# Patient Record
Sex: Male | Born: 1955 | Race: Black or African American | Hispanic: No | Marital: Married | Smoking: Former smoker
Health system: Southern US, Community
[De-identification: ages and names within clinical notes are randomized; demographics above are authoritative.]

## PROBLEM LIST (undated history)

## (undated) DIAGNOSIS — M1A00X0 Idiopathic chronic gout, unspecified site, without tophus (tophi): Secondary | ICD-10-CM

## (undated) DIAGNOSIS — N35919 Unspecified urethral stricture, male, unspecified site: Secondary | ICD-10-CM

## (undated) DIAGNOSIS — T485X5A Adverse effect of other anti-common-cold drugs, initial encounter: Secondary | ICD-10-CM

## (undated) DIAGNOSIS — J310 Chronic rhinitis: Secondary | ICD-10-CM

## (undated) DIAGNOSIS — E785 Hyperlipidemia, unspecified: Secondary | ICD-10-CM

## (undated) DIAGNOSIS — I10 Essential (primary) hypertension: Secondary | ICD-10-CM

## (undated) DIAGNOSIS — H409 Unspecified glaucoma: Secondary | ICD-10-CM

## (undated) DIAGNOSIS — N390 Urinary tract infection, site not specified: Secondary | ICD-10-CM

## (undated) DIAGNOSIS — E119 Type 2 diabetes mellitus without complications: Secondary | ICD-10-CM

## (undated) DIAGNOSIS — M1A9XX0 Chronic gout, unspecified, without tophus (tophi): Secondary | ICD-10-CM

## (undated) HISTORY — DX: Type 2 diabetes mellitus without complications: E11.9

## (undated) HISTORY — PX: TONSILLECTOMY: SUR1361

## (undated) HISTORY — PX: CIRCUMCISION: SUR203

## (undated) HISTORY — DX: Chronic rhinitis: J31.0

## (undated) HISTORY — DX: Adverse effect of other anti-common-cold drugs, initial encounter: T48.5X5A

## (undated) HISTORY — DX: Chronic gout, unspecified, without tophus (tophi): M1A.9XX0

## (undated) HISTORY — DX: Morbid (severe) obesity due to excess calories: E66.01

## (undated) HISTORY — DX: Unspecified glaucoma: H40.9

## (undated) HISTORY — DX: Essential (primary) hypertension: I10

## (undated) HISTORY — DX: Idiopathic chronic gout, unspecified site, without tophus (tophi): M1A.00X0

## (undated) HISTORY — DX: Hyperlipidemia, unspecified: E78.5

---

## 2006-11-01 ENCOUNTER — Emergency Department: Payer: Self-pay | Admitting: Emergency Medicine

## 2006-11-16 ENCOUNTER — Ambulatory Visit: Payer: Self-pay | Admitting: Urology

## 2008-12-07 ENCOUNTER — Ambulatory Visit: Payer: Self-pay | Admitting: Gastroenterology

## 2009-06-18 ENCOUNTER — Ambulatory Visit: Payer: Self-pay | Admitting: Anesthesiology

## 2009-06-18 ENCOUNTER — Ambulatory Visit: Payer: Self-pay | Admitting: Urology

## 2012-04-26 ENCOUNTER — Ambulatory Visit: Payer: Self-pay | Admitting: Family Medicine

## 2012-05-14 ENCOUNTER — Ambulatory Visit: Payer: Self-pay | Admitting: Family Medicine

## 2012-06-13 ENCOUNTER — Ambulatory Visit: Payer: Self-pay | Admitting: Family Medicine

## 2012-07-14 ENCOUNTER — Ambulatory Visit: Payer: Self-pay | Admitting: Family Medicine

## 2014-06-27 ENCOUNTER — Ambulatory Visit: Payer: Self-pay | Admitting: Gastroenterology

## 2015-01-26 ENCOUNTER — Telehealth: Payer: Self-pay

## 2015-01-26 DIAGNOSIS — M1A00X0 Idiopathic chronic gout, unspecified site, without tophus (tophi): Secondary | ICD-10-CM | POA: Insufficient documentation

## 2015-01-26 DIAGNOSIS — E1165 Type 2 diabetes mellitus with hyperglycemia: Secondary | ICD-10-CM | POA: Insufficient documentation

## 2015-01-26 DIAGNOSIS — E785 Hyperlipidemia, unspecified: Secondary | ICD-10-CM | POA: Insufficient documentation

## 2015-01-26 DIAGNOSIS — I10 Essential (primary) hypertension: Secondary | ICD-10-CM | POA: Insufficient documentation

## 2015-01-26 NOTE — Telephone Encounter (Signed)
90 day supply Amlodipine 

## 2015-01-29 MED ORDER — AMLODIPINE BESYLATE 10 MG PO TABS: 10.0000 mg | ORAL_TABLET | Freq: Every day | ORAL | Status: DC

## 2015-01-31 ENCOUNTER — Telehealth: Payer: Self-pay

## 2015-01-31 MED ORDER — LOSARTAN POTASSIUM 100 MG PO TABS: 100.0000 mg | ORAL_TABLET | Freq: Every day | ORAL | Status: DC

## 2015-01-31 NOTE — Telephone Encounter (Signed)
Requesting 90 day Rx for Losartan Potassium Tab 

## 2015-07-18 ENCOUNTER — Other Ambulatory Visit: Payer: Self-pay

## 2015-07-18 ENCOUNTER — Other Ambulatory Visit: Payer: Self-pay | Admitting: Family Medicine

## 2015-07-18 MED ORDER — TAMSULOSIN HCL 0.4 MG PO CAPS: 0.4000 mg | ORAL_CAPSULE | Freq: Every day | ORAL | Status: DC

## 2015-07-18 NOTE — Telephone Encounter (Signed)
apt 

## 2015-07-18 NOTE — Telephone Encounter (Signed)
[facility] requesting 90 day Rx for Tamsulosin Last visit 10/31/14

## 2015-07-23 ENCOUNTER — Emergency Department: Payer: BLUE CROSS/BLUE SHIELD

## 2015-07-23 ENCOUNTER — Emergency Department
Admission: EM | Admit: 2015-07-23 | Discharge: 2015-07-23 | Disposition: A | Discharge status: Home / Self Care | Payer: BLUE CROSS/BLUE SHIELD | Source: Home / Self Care | Attending: Emergency Medicine | Admitting: Emergency Medicine

## 2015-07-23 DIAGNOSIS — Z7951 Long term (current) use of inhaled steroids: Secondary | ICD-10-CM

## 2015-07-23 DIAGNOSIS — S40011A Contusion of right shoulder, initial encounter: Secondary | ICD-10-CM

## 2015-07-23 DIAGNOSIS — S99922A Unspecified injury of left foot, initial encounter: Secondary | ICD-10-CM

## 2015-07-23 DIAGNOSIS — Y998 Other external cause status: Secondary | ICD-10-CM

## 2015-07-23 DIAGNOSIS — W06XXXA Fall from bed, initial encounter: Secondary | ICD-10-CM | POA: Insufficient documentation

## 2015-07-23 DIAGNOSIS — N390 Urinary tract infection, site not specified: Secondary | ICD-10-CM

## 2015-07-23 DIAGNOSIS — R571 Hypovolemic shock: Secondary | ICD-10-CM | POA: Diagnosis not present

## 2015-07-23 DIAGNOSIS — Z87891 Personal history of nicotine dependence: Secondary | ICD-10-CM | POA: Insufficient documentation

## 2015-07-23 DIAGNOSIS — R824 Acetonuria: Secondary | ICD-10-CM

## 2015-07-23 DIAGNOSIS — Y9389 Activity, other specified: Secondary | ICD-10-CM | POA: Insufficient documentation

## 2015-07-23 DIAGNOSIS — Z79899 Other long term (current) drug therapy: Secondary | ICD-10-CM

## 2015-07-23 DIAGNOSIS — Z794 Long term (current) use of insulin: Secondary | ICD-10-CM

## 2015-07-23 DIAGNOSIS — Z7982 Long term (current) use of aspirin: Secondary | ICD-10-CM

## 2015-07-23 DIAGNOSIS — E1165 Type 2 diabetes mellitus with hyperglycemia: Secondary | ICD-10-CM

## 2015-07-23 DIAGNOSIS — Z7984 Long term (current) use of oral hypoglycemic drugs: Secondary | ICD-10-CM | POA: Insufficient documentation

## 2015-07-23 DIAGNOSIS — E119 Type 2 diabetes mellitus without complications: Secondary | ICD-10-CM

## 2015-07-23 DIAGNOSIS — Y9289 Other specified places as the place of occurrence of the external cause: Secondary | ICD-10-CM

## 2015-07-23 DIAGNOSIS — I10 Essential (primary) hypertension: Secondary | ICD-10-CM | POA: Insufficient documentation

## 2015-07-23 DIAGNOSIS — I214 Non-ST elevation (NSTEMI) myocardial infarction: Secondary | ICD-10-CM | POA: Diagnosis not present

## 2015-07-23 DIAGNOSIS — M79672 Pain in left foot: Secondary | ICD-10-CM

## 2015-07-23 LAB — COMPREHENSIVE METABOLIC PANEL
ALBUMIN: 3.5 g/dL (ref 3.5–5.0)
ALK PHOS: 86 U/L (ref 38–126)
ALT: 36 U/L (ref 17–63)
AST: 20 U/L (ref 15–41)
Anion gap: 14 (ref 5–15)
BILIRUBIN TOTAL: 2.1 mg/dL — AB (ref 0.3–1.2)
BUN: 28 mg/dL — AB (ref 6–20)
CALCIUM: 8.8 mg/dL — AB (ref 8.9–10.3)
CO2: 20 mmol/L — AB (ref 22–32)
CREATININE: 1.03 mg/dL (ref 0.61–1.24)
Chloride: 93 mmol/L — ABNORMAL LOW (ref 101–111)
GFR calc Af Amer: >60 mL/min (ref >60)
GFR calc non Af Amer: >60 mL/min (ref >60)
GLUCOSE: 436 mg/dL — AB (ref 65–99)
Potassium: 3.8 mmol/L (ref 3.5–5.1)
SODIUM: 127 mmol/L — AB (ref 135–145)
Total Protein: 6.9 g/dL (ref 6.5–8.1)

## 2015-07-23 LAB — URINALYSIS COMPLETE WITH MICROSCOPIC (ARMC ONLY)
BILIRUBIN URINE: NEGATIVE
Bacteria, UA: NONE SEEN
Nitrite: NEGATIVE
PH: 5 (ref 5.0–8.0)
PROTEIN: NEGATIVE mg/dL
Specific Gravity, Urine: 1.028 (ref 1.005–1.030)

## 2015-07-23 LAB — CBC WITH DIFFERENTIAL/PLATELET
BASOS PCT: 1 %
Basophils Absolute: 0.1 10*3/uL (ref 0–0.1)
EOS ABS: 0.1 10*3/uL (ref 0–0.7)
Eosinophils Relative: 0 %
HEMATOCRIT: 44.2 % (ref 40.0–52.0)
HEMOGLOBIN: 14.7 g/dL (ref 13.0–18.0)
LYMPHS ABS: 0.5 10*3/uL — AB (ref 1.0–3.6)
Lymphocytes Relative: 4 %
MCH: 26.8 pg (ref 26.0–34.0)
MCHC: 33.2 g/dL (ref 32.0–36.0)
MCV: 80.8 fL (ref 80.0–100.0)
Monocytes Absolute: 1.1 10*3/uL — ABNORMAL HIGH (ref 0.2–1.0)
Monocytes Relative: 8 %
NEUTROS ABS: 11.6 10*3/uL — AB (ref 1.4–6.5)
NEUTROS PCT: 87 %
Platelets: 134 10*3/uL — ABNORMAL LOW (ref 150–440)
RBC: 5.47 MIL/uL (ref 4.40–5.90)
RDW: 13.5 % (ref 11.5–14.5)
WBC: 13.3 10*3/uL — AB (ref 3.8–10.6)

## 2015-07-23 LAB — GLUCOSE, CAPILLARY: Glucose-Capillary: 319 mg/dL — ABNORMAL HIGH (ref 65–99)

## 2015-07-23 MED ORDER — OXYCODONE-ACETAMINOPHEN 5-325 MG PO TABS
2.0000 | ORAL_TABLET | Freq: Once | ORAL | Status: AC
  Administered 2015-07-23: 2 via ORAL
  Filled 2015-07-23: qty 2

## 2015-07-23 MED ORDER — INSULIN ASPART 100 UNIT/ML ~~LOC~~ SOLN
10.0000 [IU] | Freq: Once | SUBCUTANEOUS | Status: AC
  Administered 2015-07-23: 10 [IU] via INTRAVENOUS
  Filled 2015-07-23: qty 10

## 2015-07-23 MED ORDER — OXYCODONE-ACETAMINOPHEN 5-325 MG PO TABS: 1.0000 | ORAL_TABLET | ORAL | Status: DC | PRN

## 2015-07-23 MED ORDER — SODIUM CHLORIDE 0.9 % IV BOLUS (SEPSIS)
1000.0000 mL | Freq: Once | INTRAVENOUS | Status: AC
  Administered 2015-07-23: 1000 mL via INTRAVENOUS

## 2015-07-23 MED ORDER — SULFAMETHOXAZOLE-TRIMETHOPRIM 800-160 MG PO TABS: 1.0000 | ORAL_TABLET | Freq: Two times a day (BID) | ORAL | Status: DC

## 2015-07-23 NOTE — ED Notes (Signed)
States he has several c/o's    Having pain to left foot for couple of days  Unsure of injury. Also having some discomfort in shoulder

## 2015-07-23 NOTE — Discharge Instructions (Signed)
You will need to follow up with her doctor in 10 days if any continued urinary symptoms. Increase fluids. Check blood sugars routinely. Get better control of your blood sugars Percocet as needed for pain. Bactrim DS twice a day for 10 days for your urinary tract infection. Ice and elevate your left ankle for swelling and pain.

## 2015-07-23 NOTE — ED Notes (Signed)
Pt reports came in from shoveling snow and his son took his shoe off his left foot and bent it up. Pt reports pain to left foot since then. Pt also reports fell last pm and hurt his right shoulder, pt also c/o urinary difficulties states it is hard to make a stream. Pt reports is on meds for it but it is uncomfortable.

## 2015-07-23 NOTE — ED Provider Notes (Signed)
[facility] Emergency Department Provider Note  ____________________________________________  Time seen: Approximately 12:50 PM  I have reviewed the triage vital signs and the nursing notes.   HISTORY  Chief Complaint Dysuria; Foot Pain; and Shoulder Pain   HPI Edward Myers is a 60 y.o. male is here with multiple complaints. Patient states that last evening he fell from the bed and hit his right shoulder and continues to have right shoulder pain today. He also states that he was shoveling snow with his son and does not recall any injury that began having left foot pain. Patient does have a history of gout but states this feels quite different from his normal gouty arthritis attacks. He also states he is having some urinary difficulties. He denies any fever or chills. He has had no nausea, vomiting or diarrhea.He also has a history of having upper respiratory symptoms and his wife was sick 2 weeks ago with similar symptoms. Wife reports that patient has not taken his insulin or metformin in the last 3 days. Asked why patient states "I was too sick to get out of bed to do it". He rates his pain is 7 out of 10.   Past Medical History  Diagnosis Date  . Chronic gouty arthropathy   . Glaucoma   . Hyperlipidemia   . Hypertension   . Rhinitis medicamentosa   . Diabetes mellitus without complication   . Morbid obesity     Patient Active Problem List   Diagnosis Date Noted  . Hyperlipidemia   . Hypertension   . Chronic gouty arthropathy   . Diabetes mellitus without complication [facility])     Past Surgical History  Procedure Laterality Date  . Tonsillectomy    . Circumcision      Current Outpatient Rx  Name  Route  Sig  Dispense  Refill  . allopurinol (ZYLOPRIM) 100 MG tablet   Oral   Take 150 mg by mouth daily.         Marland Kitchen amLODipine (NORVASC) 10 MG tablet   Oral   Take 1 tablet (10 mg total) by mouth daily.   90 tablet   1   . aspirin EC 81  MG tablet   Oral   Take 81 mg by mouth daily.         Marland Kitchen atorvastatin (LIPITOR) 80 MG tablet   Oral   Take 80 mg by mouth daily.         . Dulaglutide (TRULICITY) 1.5 MG/0.5ML SOPN   Subcutaneous   Inject into the skin once a week.         . fexofenadine-pseudoephedrine (ALLEGRA-D) 60-120 MG per tablet   Oral   Take 1 tablet by mouth 2 (two) times daily as needed.         . fluticasone (FLONASE) 50 MCG/ACT nasal spray   Each Nare   Place 2 sprays into both nostrils daily.         . hydrochlorothiazide (HYDRODIURIL) 25 MG tablet   Oral   Take 25 mg by mouth daily.         . insulin aspart protamine - aspart (NOVOLOG MIX 70/30 FLEXPEN) (70-30) 100 UNIT/ML FlexPen   Subcutaneous   Inject 20-30 Units into the skin daily.         Marland Kitchen losartan (COZAAR) 100 MG tablet   Oral   Take 1 tablet (100 mg total) by mouth daily.   90 tablet   1   . metFORMIN (GLUCOPHAGE)  500 MG tablet   Oral   Take 1,000 mg by mouth 2 (two) times daily with a meal.         . oxyCODONE-acetaminophen (PERCOCET) 5-325 MG tablet   Oral   Take 1 tablet by mouth every 4 (four) hours as needed for severe pain.   20 tablet   0   . sulfamethoxazole-trimethoprim (BACTRIM DS,SEPTRA DS) 800-160 MG tablet   Oral   Take 1 tablet by mouth 2 (two) times daily.   20 tablet   0   . tamsulosin (FLOMAX) 0.4 MG CAPS capsule   Oral   Take 1 capsule (0.4 mg total) by mouth daily.   30 capsule   4     Allergies Review of patient's allergies indicates no known allergies.  Family History  Problem Relation Age of Onset  . Heart attack Mother   . Diabetes Mother     Social History Social History  Substance Use Topics  . Smoking status: Former Smoker    Types: Cigars    Quit date: 01/25/2010  . Smokeless tobacco: Not on file  . Alcohol Use: Yes    Review of Systems Constitutional: No fever/chills ENT: No sore throat. Cardiovascular: Denies chest pain. Respiratory: Denies shortness of  breath. Gastrointestinal: No abdominal pain.  No nausea, no vomiting.   Genitourinary: Positive for dysuria. Musculoskeletal: Negative for back pain. Positive left foot pain and positive right shoulder pain. Skin: Negative for rash. Neurological: Negative for headaches, focal weakness or numbness.  10-point ROS otherwise negative.  ____________________________________________   PHYSICAL EXAM:  VITAL SIGNS: ED Triage Vitals  Enc Vitals Group     BP 07/23/15 1151 95/76 mmHg     Pulse Rate 07/23/15 1151 104     Resp 07/23/15 1151 18     Temp 07/23/15 1151 97.8 F (36.6 C)     Temp Source 07/23/15 1151 Oral     SpO2 07/23/15 1151 97 %     Weight 07/23/15 1151 300 lb (136.079 kg)     Height 07/23/15 1151 6' (1.829 m)     Head Cir --      Peak Flow --      Pain Score 07/23/15 1156 7     Pain Loc --      Pain Edu? --      Excl. in GC? --     Constitutional: Alert and oriented. Well appearing and in no acute distress. Eyes: Conjunctivae are normal. PERRL. EOMI. Head: Atraumatic. Nose: No congestion/rhinnorhea. Mouth/Throat: Mucous membranes are moist.  Oropharynx non-erythematous. Neck: No stridor.   Cardiovascular: Normal rate, regular rhythm. Grossly normal heart sounds.  Good peripheral circulation. Respiratory: Normal respiratory effort.  No retractions. Lungs CTAB. Gastrointestinal: Soft and nontender. No distention. No CVA tenderness. Bowel sounds are active 4 quadrants. Musculoskeletal: Examination of the right shoulder there is no gross deformity however range of motion is difficult secondary to patient's pain and patient guards with abduction or abduction. No soft tissue swelling is noted. Left ankle with marked tenderness on palpation and minimal soft tissue swelling on the lateral malleolus anterior aspect. Range of motion is restricted secondary to patient's pain. Pulses intact there is no ecchymosis or abrasions seen. Neurologic:  Normal speech and language. No gross  focal neurologic deficits are appreciated. No gait instability. Skin:  Skin is warm, dry and intact. No rash noted. No warmth or redness seen to the left ankle or foot. Skin is intact Psychiatric: Mood and affect are normal. Speech and  behavior are normal.  ____________________________________________   LABS (all labs ordered are listed, but only abnormal results are displayed)  Labs Reviewed  URINALYSIS COMPLETEWITH MICROSCOPIC ([facility] ONLY) - Abnormal; Notable for the following:    Color, Urine YELLOW (*)    APPearance CLEAR (*)    Glucose, UA >500 (*)    Ketones, ur 2+ (*)    Hgb urine dipstick 2+ (*)    Leukocytes, UA TRACE (*)    Squamous Epithelial / LPF 0-5 (*)    All other components within normal limits  CBC WITH DIFFERENTIAL/PLATELET - Abnormal; Notable for the following:    WBC 13.3 (*)    Platelets 134 (*)    Neutro Abs 11.6 (*)    Lymphs Abs 0.5 (*)    Monocytes Absolute 1.1 (*)    All other components within normal limits  COMPREHENSIVE METABOLIC PANEL - Abnormal; Notable for the following:    Sodium 127 (*)    Chloride 93 (*)    CO2 20 (*)    Glucose, Bld 436 (*)    BUN 28 (*)    Calcium 8.8 (*)    Total Bilirubin 2.1 (*)    All other components within normal limits  GLUCOSE, CAPILLARY - Abnormal; Notable for the following:    Glucose-Capillary 319 (*)    All other components within normal limits  URINE CULTURE  CBG MONITORING, ED   RADIOLOGY Right shoulder per radiologist shows degenerative changes but no acute findings. Left ankle and degenerative changes and  calcaneal spurring  ____________________________________________   PROCEDURES  Procedure(s) performed: None  Critical Care performed: No  ____________________________________________   INITIAL IMPRESSION / ASSESSMENT AND PLAN / ED COURSE  Pertinent labs & imaging results that were available during my care of the patient were reviewed by me and considered in my medical decision making  (see chart for details).  ----------------------------------------- 5:30 PM on 07/23/2015 ----------------------------------------- Patient states he is feeling much better and does not want to wait any longer for his pressure did come down more than 319. Patient states that he is feeling better. He will follow up with Dr. Yetta Numbers in 10 days to recheck urine to make sure that the infection has cleared up. He is also going to be more diligent about checking his blood sugar and adhering to a diabetic diet and medication. Patient is placed on Percocet as needed for pain and Bactrim DS twice a day for 10 days.  ____________________________________________   FINAL CLINICAL IMPRESSION(S) / ED DIAGNOSES  Final diagnoses:  Urinary tract infection, acute  Contusion of right shoulder, initial encounter  Acute foot pain, left  Poorly controlled diabetes mellitus (HCC)  Ketonuria      Tommi Rumps, PA-C 07/23/15 1743  Jene Every, MD 07/28/15 [phone]

## 2015-07-25 ENCOUNTER — Encounter: Payer: Self-pay | Admitting: Emergency Medicine

## 2015-07-25 ENCOUNTER — Inpatient Hospital Stay
Admission: EM | Admit: 2015-07-25 | Discharge: 2015-07-31 | DRG: 872 | Disposition: A | Discharge status: Home / Self Care | Payer: BLUE CROSS/BLUE SHIELD | Attending: Internal Medicine | Admitting: Internal Medicine

## 2015-07-25 ENCOUNTER — Emergency Department: Payer: BLUE CROSS/BLUE SHIELD

## 2015-07-25 DIAGNOSIS — N400 Enlarged prostate without lower urinary tract symptoms: Secondary | ICD-10-CM | POA: Diagnosis present

## 2015-07-25 DIAGNOSIS — I2489 Other forms of acute ischemic heart disease: Secondary | ICD-10-CM | POA: Diagnosis present

## 2015-07-25 DIAGNOSIS — Z833 Family history of diabetes mellitus: Secondary | ICD-10-CM

## 2015-07-25 DIAGNOSIS — Z7982 Long term (current) use of aspirin: Secondary | ICD-10-CM

## 2015-07-25 DIAGNOSIS — E860 Dehydration: Secondary | ICD-10-CM | POA: Diagnosis present

## 2015-07-25 DIAGNOSIS — H409 Unspecified glaucoma: Secondary | ICD-10-CM | POA: Diagnosis present

## 2015-07-25 DIAGNOSIS — M2570 Osteophyte, unspecified joint: Secondary | ICD-10-CM | POA: Diagnosis present

## 2015-07-25 DIAGNOSIS — R197 Diarrhea, unspecified: Secondary | ICD-10-CM | POA: Diagnosis present

## 2015-07-25 DIAGNOSIS — M6740 Ganglion, unspecified site: Secondary | ICD-10-CM | POA: Diagnosis present

## 2015-07-25 DIAGNOSIS — E1165 Type 2 diabetes mellitus with hyperglycemia: Secondary | ICD-10-CM | POA: Diagnosis present

## 2015-07-25 DIAGNOSIS — M25572 Pain in left ankle and joints of left foot: Secondary | ICD-10-CM

## 2015-07-25 DIAGNOSIS — Z9889 Other specified postprocedural states: Secondary | ICD-10-CM | POA: Diagnosis not present

## 2015-07-25 DIAGNOSIS — R579 Shock, unspecified: Secondary | ICD-10-CM | POA: Diagnosis present

## 2015-07-25 DIAGNOSIS — M25472 Effusion, left ankle: Secondary | ICD-10-CM

## 2015-07-25 DIAGNOSIS — A419 Sepsis, unspecified organism: Secondary | ICD-10-CM

## 2015-07-25 DIAGNOSIS — E871 Hypo-osmolality and hyponatremia: Secondary | ICD-10-CM | POA: Diagnosis present

## 2015-07-25 DIAGNOSIS — Z79899 Other long term (current) drug therapy: Secondary | ICD-10-CM

## 2015-07-25 DIAGNOSIS — I248 Other forms of acute ischemic heart disease: Secondary | ICD-10-CM | POA: Diagnosis present

## 2015-07-25 DIAGNOSIS — I10 Essential (primary) hypertension: Secondary | ICD-10-CM | POA: Diagnosis present

## 2015-07-25 DIAGNOSIS — E785 Hyperlipidemia, unspecified: Secondary | ICD-10-CM | POA: Diagnosis present

## 2015-07-25 DIAGNOSIS — R571 Hypovolemic shock: Secondary | ICD-10-CM | POA: Diagnosis present

## 2015-07-25 DIAGNOSIS — Z8249 Family history of ischemic heart disease and other diseases of the circulatory system: Secondary | ICD-10-CM | POA: Diagnosis not present

## 2015-07-25 DIAGNOSIS — Z794 Long term (current) use of insulin: Secondary | ICD-10-CM | POA: Diagnosis not present

## 2015-07-25 DIAGNOSIS — Z87891 Personal history of nicotine dependence: Secondary | ICD-10-CM | POA: Diagnosis not present

## 2015-07-25 DIAGNOSIS — M79672 Pain in left foot: Secondary | ICD-10-CM | POA: Diagnosis present

## 2015-07-25 DIAGNOSIS — R52 Pain, unspecified: Secondary | ICD-10-CM

## 2015-07-25 DIAGNOSIS — W06XXXA Fall from bed, initial encounter: Secondary | ICD-10-CM | POA: Diagnosis present

## 2015-07-25 DIAGNOSIS — M1000 Idiopathic gout, unspecified site: Secondary | ICD-10-CM | POA: Diagnosis present

## 2015-07-25 DIAGNOSIS — G4733 Obstructive sleep apnea (adult) (pediatric): Secondary | ICD-10-CM | POA: Diagnosis present

## 2015-07-25 DIAGNOSIS — R609 Edema, unspecified: Secondary | ICD-10-CM

## 2015-07-25 DIAGNOSIS — M19011 Primary osteoarthritis, right shoulder: Secondary | ICD-10-CM | POA: Diagnosis present

## 2015-07-25 DIAGNOSIS — Z6841 Body Mass Index (BMI) 40.0 and over, adult: Secondary | ICD-10-CM | POA: Diagnosis not present

## 2015-07-25 DIAGNOSIS — I214 Non-ST elevation (NSTEMI) myocardial infarction: Secondary | ICD-10-CM

## 2015-07-25 DIAGNOSIS — I959 Hypotension, unspecified: Secondary | ICD-10-CM

## 2015-07-25 DIAGNOSIS — I4510 Unspecified right bundle-branch block: Secondary | ICD-10-CM | POA: Diagnosis present

## 2015-07-25 DIAGNOSIS — T502X5A Adverse effect of carbonic-anhydrase inhibitors, benzothiadiazides and other diuretics, initial encounter: Secondary | ICD-10-CM | POA: Diagnosis present

## 2015-07-25 DIAGNOSIS — K5900 Constipation, unspecified: Secondary | ICD-10-CM | POA: Diagnosis present

## 2015-07-25 DIAGNOSIS — B001 Herpesviral vesicular dermatitis: Secondary | ICD-10-CM | POA: Diagnosis present

## 2015-07-25 DIAGNOSIS — M109 Gout, unspecified: Secondary | ICD-10-CM

## 2015-07-25 HISTORY — DX: Urinary tract infection, site not specified: N39.0

## 2015-07-25 LAB — COMPREHENSIVE METABOLIC PANEL
ALT: 26 U/L (ref 17–63)
AST: 13 U/L — AB (ref 15–41)
Albumin: 2.9 g/dL — ABNORMAL LOW (ref 3.5–5.0)
Alkaline Phosphatase: 97 U/L (ref 38–126)
Anion gap: 14 (ref 5–15)
BUN: 40 mg/dL — AB (ref 6–20)
CHLORIDE: 90 mmol/L — AB (ref 101–111)
CO2: 20 mmol/L — ABNORMAL LOW (ref 22–32)
Calcium: 8.1 mg/dL — ABNORMAL LOW (ref 8.9–10.3)
Creatinine, Ser: 1.09 mg/dL (ref 0.61–1.24)
Glucose, Bld: 452 mg/dL — ABNORMAL HIGH (ref 65–99)
POTASSIUM: 3.7 mmol/L (ref 3.5–5.1)
Sodium: 124 mmol/L — ABNORMAL LOW (ref 135–145)
Total Bilirubin: 1.3 mg/dL — ABNORMAL HIGH (ref 0.3–1.2)
Total Protein: 6.4 g/dL — ABNORMAL LOW (ref 6.5–8.1)

## 2015-07-25 LAB — CBC WITH DIFFERENTIAL/PLATELET
BASOS ABS: 0 10*3/uL (ref 0–0.1)
BASOS PCT: 0 %
Eosinophils Absolute: 0 10*3/uL (ref 0–0.7)
Eosinophils Relative: 0 %
HEMATOCRIT: 41.5 % (ref 40.0–52.0)
HEMOGLOBIN: 13.6 g/dL (ref 13.0–18.0)
Lymphocytes Relative: 6 %
Lymphs Abs: 1 10*3/uL (ref 1.0–3.6)
MCH: 26.8 pg (ref 26.0–34.0)
MCHC: 32.8 g/dL (ref 32.0–36.0)
MCV: 81.6 fL (ref 80.0–100.0)
Monocytes Absolute: 1.8 10*3/uL — ABNORMAL HIGH (ref 0.2–1.0)
Monocytes Relative: 11 %
NEUTROS ABS: 14.2 10*3/uL — AB (ref 1.4–6.5)
NEUTROS PCT: 83 %
Platelets: 149 10*3/uL — ABNORMAL LOW (ref 150–440)
RBC: 5.09 MIL/uL (ref 4.40–5.90)
RDW: 13.6 % (ref 11.5–14.5)
WBC: 17.1 10*3/uL — AB (ref 3.8–10.6)

## 2015-07-25 LAB — LACTIC ACID, PLASMA
LACTIC ACID, VENOUS: 2.3 mmol/L — AB (ref 0.5–2.0)
Lactic Acid, Venous: 1.9 mmol/L (ref 0.5–2.0)

## 2015-07-25 LAB — URINALYSIS COMPLETE WITH MICROSCOPIC (ARMC ONLY)
BACTERIA UA: NONE SEEN
Bilirubin Urine: NEGATIVE
Glucose, UA: >500 mg/dL — AB
Nitrite: NEGATIVE
PH: 5 (ref 5.0–8.0)
PROTEIN: NEGATIVE mg/dL
SPECIFIC GRAVITY, URINE: 1.022 (ref 1.005–1.030)

## 2015-07-25 LAB — MRSA PCR SCREENING: MRSA by PCR: NEGATIVE

## 2015-07-25 LAB — URIC ACID
URIC ACID, SERUM: 9.4 mg/dL — AB (ref 4.4–7.6)
Uric Acid, Serum: 9.8 mg/dL — ABNORMAL HIGH (ref 4.4–7.6)

## 2015-07-25 LAB — PROTIME-INR
INR: 1.32
PROTHROMBIN TIME: 16.5 s — AB (ref 11.4–15.0)

## 2015-07-25 LAB — TROPONIN I: TROPONIN I: 0.12 ng/mL — AB (ref <0.031)

## 2015-07-25 LAB — GLUCOSE, CAPILLARY
GLUCOSE-CAPILLARY: 387 mg/dL — AB (ref 65–99)
GLUCOSE-CAPILLARY: 396 mg/dL — AB (ref 65–99)

## 2015-07-25 LAB — APTT: APTT: 28 s (ref 24–36)

## 2015-07-25 LAB — LIPASE, BLOOD: LIPASE: 23 U/L (ref 11–51)

## 2015-07-25 LAB — HEPARIN LEVEL (UNFRACTIONATED): Heparin Unfractionated: <0.1 IU/mL — ABNORMAL LOW (ref 0.30–0.70)

## 2015-07-25 MED ORDER — PIPERACILLIN-TAZOBACTAM 3.375 G IVPB 30 MIN
3.3750 g | Freq: Once | INTRAVENOUS | Status: AC
  Administered 2015-07-25: 3.375 g via INTRAVENOUS
  Filled 2015-07-25: qty 50

## 2015-07-25 MED ORDER — SODIUM CHLORIDE 0.9 % IV BOLUS (SEPSIS)
500.0000 mL | INTRAVENOUS | Status: AC
  Administered 2015-07-25: 500 mL via INTRAVENOUS

## 2015-07-25 MED ORDER — PIPERACILLIN-TAZOBACTAM 3.375 G IVPB
3.3750 g | Freq: Three times a day (TID) | INTRAVENOUS | Status: DC
  Administered 2015-07-25: 3.375 g via INTRAVENOUS
  Filled 2015-07-25 (×3): qty 50

## 2015-07-25 MED ORDER — OXYCODONE-ACETAMINOPHEN 5-325 MG PO TABS
1.0000 | ORAL_TABLET | ORAL | Status: DC | PRN
Start: 2015-07-25 — End: 2015-07-27
  Administered 2015-07-25 – 2015-07-26 (×4): 1 via ORAL
  Filled 2015-07-25 (×4): qty 1

## 2015-07-25 MED ORDER — SODIUM CHLORIDE 0.9 % IV BOLUS (SEPSIS)
1000.0000 mL | INTRAVENOUS | Status: AC
  Administered 2015-07-25: 1000 mL via INTRAVENOUS

## 2015-07-25 MED ORDER — HEPARIN BOLUS VIA INFUSION
3300.0000 [IU] | Freq: Once | INTRAVENOUS | Status: AC
  Administered 2015-07-25: 3300 [IU] via INTRAVENOUS
  Filled 2015-07-25: qty 3300

## 2015-07-25 MED ORDER — PIPERACILLIN-TAZOBACTAM 4.5 G IVPB
4.5000 g | Freq: Three times a day (TID) | INTRAVENOUS | Status: DC
  Administered 2015-07-26 – 2015-07-27 (×5): 4.5 g via INTRAVENOUS
  Filled 2015-07-25 (×6): qty 100

## 2015-07-25 MED ORDER — HEPARIN (PORCINE) IN NACL 100-0.45 UNIT/ML-% IJ SOLN
1900.0000 [IU]/h | INTRAMUSCULAR | Status: DC
  Administered 2015-07-25: 1450 [IU]/h via INTRAVENOUS
  Administered 2015-07-26: 1900 [IU]/h via INTRAVENOUS
  Filled 2015-07-25 (×5): qty 250

## 2015-07-25 MED ORDER — FLUTICASONE PROPIONATE 50 MCG/ACT NA SUSP
2.0000 | Freq: Every day | NASAL | Status: DC | PRN
  Filled 2015-07-25: qty 16

## 2015-07-25 MED ORDER — INSULIN ASPART 100 UNIT/ML ~~LOC~~ SOLN
0.0000 [IU] | SUBCUTANEOUS | Status: DC
  Administered 2015-07-25: 9 [IU] via SUBCUTANEOUS
  Administered 2015-07-26 (×3): 7 [IU] via SUBCUTANEOUS
  Filled 2015-07-25: qty 9
  Filled 2015-07-25 (×3): qty 7

## 2015-07-25 MED ORDER — ASPIRIN EC 81 MG PO TBEC
81.0000 mg | DELAYED_RELEASE_TABLET | Freq: Every day | ORAL | Status: DC
  Administered 2015-07-26 – 2015-07-31 (×6): 81 mg via ORAL
  Filled 2015-07-25 (×6): qty 1

## 2015-07-25 MED ORDER — VANCOMYCIN HCL IN DEXTROSE 1-5 GM/200ML-% IV SOLN: 1000.0000 mg | Freq: Once | INTRAVENOUS | Status: AC

## 2015-07-25 MED ORDER — MORPHINE SULFATE (PF) 4 MG/ML IV SOLN
4.0000 mg | Freq: Once | INTRAVENOUS | Status: AC
  Administered 2015-07-25: 4 mg via INTRAVENOUS
  Filled 2015-07-25: qty 1

## 2015-07-25 MED ORDER — ENOXAPARIN SODIUM 40 MG/0.4ML ~~LOC~~ SOLN
40.0000 mg | SUBCUTANEOUS | Status: DC
  Administered 2015-07-25: 40 mg via SUBCUTANEOUS
  Filled 2015-07-25: qty 0.4

## 2015-07-25 MED ORDER — HEPARIN BOLUS VIA INFUSION
4000.0000 [IU] | Freq: Once | INTRAVENOUS | Status: AC
  Administered 2015-07-25: 4000 [IU] via INTRAVENOUS
  Filled 2015-07-25: qty 4000

## 2015-07-25 MED ORDER — VANCOMYCIN HCL 10 G IV SOLR
1500.0000 mg | Freq: Two times a day (BID) | INTRAVENOUS | Status: DC
  Administered 2015-07-26: 1500 mg via INTRAVENOUS
  Filled 2015-07-25 (×2): qty 1500

## 2015-07-25 MED ORDER — ATORVASTATIN CALCIUM 20 MG PO TABS
80.0000 mg | ORAL_TABLET | Freq: Every day | ORAL | Status: DC
  Administered 2015-07-26 – 2015-07-31 (×6): 80 mg via ORAL
  Filled 2015-07-25 (×6): qty 4

## 2015-07-25 MED ORDER — ACETAMINOPHEN 650 MG RE SUPP
650.0000 mg | Freq: Four times a day (QID) | RECTAL | Status: DC | PRN
Start: 2015-07-25 — End: 2015-07-31

## 2015-07-25 MED ORDER — CETYLPYRIDINIUM CHLORIDE 0.05 % MT LIQD
7.0000 mL | Freq: Two times a day (BID) | OROMUCOSAL | Status: DC
Start: 2015-07-25 — End: 2015-07-27
  Administered 2015-07-25 – 2015-07-26 (×2): 7 mL via OROMUCOSAL

## 2015-07-25 MED ORDER — ONDANSETRON HCL 4 MG/2ML IJ SOLN
4.0000 mg | Freq: Once | INTRAMUSCULAR | Status: AC
  Administered 2015-07-25: 4 mg via INTRAVENOUS
  Filled 2015-07-25: qty 2

## 2015-07-25 MED ORDER — ONDANSETRON HCL 4 MG PO TABS: 4.0000 mg | ORAL_TABLET | Freq: Four times a day (QID) | ORAL | Status: DC | PRN

## 2015-07-25 MED ORDER — PIPERACILLIN-TAZOBACTAM 3.375 G IVPB
INTRAVENOUS | Status: AC
  Filled 2015-07-25: qty 50

## 2015-07-25 MED ORDER — ASPIRIN 81 MG PO CHEW
CHEWABLE_TABLET | ORAL | Status: AC
  Filled 2015-07-25: qty 4

## 2015-07-25 MED ORDER — LORATADINE 10 MG PO TABS
10.0000 mg | ORAL_TABLET | Freq: Every day | ORAL | Status: DC
  Administered 2015-07-26 – 2015-07-30 (×3): 10 mg via ORAL
  Filled 2015-07-25 (×6): qty 1

## 2015-07-25 MED ORDER — VANCOMYCIN HCL IN DEXTROSE 1-5 GM/200ML-% IV SOLN
1000.0000 mg | Freq: Once | INTRAVENOUS | Status: AC
  Administered 2015-07-25: 1000 mg via INTRAVENOUS
  Filled 2015-07-25: qty 200

## 2015-07-25 MED ORDER — SODIUM CHLORIDE 0.9 % IV SOLN
INTRAVENOUS | Status: DC
  Administered 2015-07-25 – 2015-07-26 (×2): via INTRAVENOUS

## 2015-07-25 MED ORDER — ONDANSETRON HCL 4 MG/2ML IJ SOLN
4.0000 mg | Freq: Four times a day (QID) | INTRAMUSCULAR | Status: DC | PRN
  Filled 2015-07-25: qty 2

## 2015-07-25 MED ORDER — ASPIRIN 81 MG PO CHEW
324.0000 mg | CHEWABLE_TABLET | Freq: Once | ORAL | Status: AC
  Administered 2015-07-25: 324 mg via ORAL

## 2015-07-25 MED ORDER — ALLOPURINOL 100 MG PO TABS
100.0000 mg | ORAL_TABLET | Freq: Every day | ORAL | Status: DC
  Administered 2015-07-26 – 2015-07-31 (×6): 100 mg via ORAL
  Filled 2015-07-25 (×6): qty 1

## 2015-07-25 MED ORDER — PNEUMOCOCCAL VAC POLYVALENT 25 MCG/0.5ML IJ INJ
0.5000 mL | INJECTION | INTRAMUSCULAR | Status: AC
  Administered 2015-07-26: 0.5 mL via INTRAMUSCULAR
  Filled 2015-07-25: qty 0.5

## 2015-07-25 MED ORDER — ACETAMINOPHEN 325 MG PO TABS
650.0000 mg | ORAL_TABLET | Freq: Four times a day (QID) | ORAL | Status: DC | PRN
  Administered 2015-07-30 – 2015-07-31 (×2): 650 mg via ORAL
  Filled 2015-07-25 (×2): qty 2

## 2015-07-25 MED ORDER — TAMSULOSIN HCL 0.4 MG PO CAPS
0.4000 mg | ORAL_CAPSULE | Freq: Every day | ORAL | Status: DC
  Administered 2015-07-26 – 2015-07-31 (×4): 0.4 mg via ORAL
  Filled 2015-07-25 (×6): qty 1

## 2015-07-25 NOTE — ED Notes (Signed)
Pt placed on 2L oxygen for oxygen saturation 88%. Dr. York CeriseForbach notified.

## 2015-07-25 NOTE — Progress Notes (Signed)
ANTICOAGULATION CONSULT NOTE - Initial Consult  Pharmacy Consult for Heparin Indication: chest pain/ACS  No Known Allergies  Patient Measurements: Height: 6\' 1"  (185.4 cm) Weight: (!) 312 lb 5 oz (141.664 kg) IBW/kg (Calculated) : 79.9 Heparin Dosing Weight: 110  Vital Signs: Temp: 98.3 F (36.8 C) (01/11 1953) Temp Source: Oral (01/11 1953) BP: 98/76 mmHg (01/11 2121) Pulse Rate: 80 (01/11 2100)  Labs:  Recent Labs  07/23/15 1428 07/25/15 1248 07/25/15 2034  HGB 14.7 13.6  --   HCT 44.2 41.5  --   PLT 134* 149*  --   APTT  --  28  --   LABPROT  --  16.5*  --   INR  --  1.32  --   HEPARINUNFRC  --   --  <0.10*  CREATININE 1.03 1.09  --   TROPONINI  --  0.12*  --     Estimated Creatinine Clearance: 110.6 mL/min (by C-G formula based on Cr of 1.09).   Medical History: Past Medical History  Diagnosis Date  . Chronic gouty arthropathy   . Glaucoma   . Hyperlipidemia   . Hypertension   . Rhinitis medicamentosa   . Diabetes mellitus without complication (HCC)   . Morbid obesity (HCC)   . UTI (urinary tract infection)     Medications:  Scheduled:  . allopurinol  100 mg Oral Daily  . antiseptic oral rinse  7 mL Mouth Rinse BID  . aspirin EC  81 mg Oral Daily  . atorvastatin  80 mg Oral Daily  . heparin  3,300 Units Intravenous Once  . loratadine  10 mg Oral Daily  . [START ON 07/26/2015] piperacillin-tazobactam (ZOSYN)  IV  4.5 g Intravenous 3 times per day  . [START ON 07/26/2015] pneumococcal 23 valent vaccine  0.5 mL Intramuscular Tomorrow-1000  . tamsulosin  0.4 mg Oral Daily  . [START ON 07/26/2015] vancomycin  1,500 mg Intravenous Q12H   Infusions:  . sodium chloride 125 mL/hr at 07/25/15 2100  . heparin 1,450 Units/hr (07/25/15 1430)    Assessment: 60 y/o M admitted with foot and shoulder pain started on heparin drip for possible NSTEMI.   Goal of Therapy:  Heparin level 0.3-0.7 units/ml Monitor platelets by anticoagulation protocol: Yes    Plan:  Give 4000 units bolus x 1 Start heparin infusion at 1450 units/hr Check anti-Xa level in 6 hours and daily while on heparin Continue to monitor H&H and platelets   1/11 : HL @ 20:30 =  < 0.1  Will order Heparin 3300 units IV X 1 bolus and increase drip rate to 1900 units/hr. Will recheck HL 6 hrs after rate change on 1/12 @   Vance Hochmuth D 07/25/2015,10:20 PM

## 2015-07-25 NOTE — ED Notes (Signed)
EKG repeated. No changes per Dr. Lenard LancePaduchowski.

## 2015-07-25 NOTE — H&P (Signed)
[facility] [place] at [facility]   PATIENT NAME: Edward Myers    MR#:  [id number]  DATE OF BIRTH:  [birth year]  DATE OF ADMISSION:  07/25/2015  PRIMARY CARE PHYSICIAN: Vonita Moss, MD   REQUESTING/REFERRING PHYSICIAN: Dr. Loleta Rose  CHIEF COMPLAINT:   Chief Complaint  Patient presents with  . Foot Pain  . Shoulder Pain  . Urinary Tract Infection    HISTORY OF PRESENT ILLNESS:  Edward Myers  is a 60 y.o. male with a known history of gout, hypertension, hyperlipidemia, diabetes type 2 without complication, morbid obesity, glaucoma who presents to the hospital due to left ankle and right shoulder pain and overall just not feeling well. Patient had a fall this past weekend and hurt his right shoulder. He presented to the emergency room on Monday and was noted to have a urinary tract infection and discharged on oral Bactrim along with some Percocet for his left ankle and right shoulder pain. He has been taking the Percocet without much improvement in his symptoms. He shouldn't also was very weak and normally is very active and therefore the wife was concerned and brought him to the ER for further evaluation. Patient also had a fever of 101 this past weekend which was treated with some as needed Motrin and he attributed to an upper respiratory infection which she got from his wife. His upper respiratory symptoms have now resolved. He presents to the emergency room and was noted to be severely hypotensive with systolic blood pressures in the 70s to 80s. He is currently afebrile here. Hospitalist services were contacted for further treatment and evaluation given his hypotension, mildly elevated troponin and ongoing foot and shoulder pain.  PAST MEDICAL HISTORY:   Past Medical History  Diagnosis Date  . Chronic gouty arthropathy   . Glaucoma   . Hyperlipidemia   . Hypertension   . Rhinitis medicamentosa   . Diabetes mellitus without complication (HCC)   .  Morbid obesity (HCC)   . UTI (urinary tract infection)     PAST SURGICAL HISTORY:   Past Surgical History  Procedure Laterality Date  . Tonsillectomy    . Circumcision      SOCIAL HISTORY:   Social History  Substance Use Topics  . Smoking status: Former Smoker    Types: Cigars    Quit date: 01/25/2010  . Smokeless tobacco: Not on file  . Alcohol Use: Yes    FAMILY HISTORY:   Family History  Problem Relation Age of Onset  . Heart attack Mother   . Diabetes Mother     DRUG ALLERGIES:  No Known Allergies  REVIEW OF SYSTEMS:   Review of Systems  Constitutional: Negative for fever and weight loss.  HENT: Negative for congestion, nosebleeds and tinnitus.   Eyes: Negative for blurred vision, double vision and redness.  Respiratory: Negative for cough, hemoptysis and shortness of breath.   Cardiovascular: Negative for chest pain, orthopnea, leg swelling and PND.  Gastrointestinal: Negative for nausea, vomiting, abdominal pain, diarrhea and melena.  Genitourinary: Negative for dysuria, urgency and hematuria.  Musculoskeletal: Positive for joint pain (eft ankle and right shoulder). Negative for falls.  Neurological: Positive for weakness. Negative for dizziness, tingling, sensory change, focal weakness, seizures and headaches.  Endo/Heme/Allergies: Negative for polydipsia. Does not bruise/bleed easily.  Psychiatric/Behavioral: Negative for depression and memory loss. The patient is not nervous/anxious.     MEDICATIONS AT HOME:   Prior to Admission medications   Medication Sig Start Date  End Date Taking? Authorizing Provider  allopurinol (ZYLOPRIM) 100 MG tablet Take 100 mg by mouth daily.    Yes Historical Provider, MD  amLODipine (NORVASC) 10 MG tablet Take 1 tablet (10 mg total) by mouth daily. 01/29/15  Yes Steele Sizer, MD  aspirin EC 81 MG tablet Take 81 mg by mouth daily.   Yes Historical Provider, MD  atorvastatin (LIPITOR) 80 MG tablet Take 80 mg by mouth  daily.   Yes Historical Provider, MD  fexofenadine-pseudoephedrine (ALLEGRA-D 24) 180-240 MG 24 hr tablet Take 1 tablet by mouth daily as needed (for allergies).   Yes Historical Provider, MD  fluticasone (FLONASE) 50 MCG/ACT nasal spray Place 2 sprays into both nostrils daily as needed for rhinitis.    Yes Historical Provider, MD  hydrochlorothiazide (HYDRODIURIL) 25 MG tablet Take 25 mg by mouth daily.   Yes Historical Provider, MD  insulin aspart protamine- aspart (NOVOLOG MIX 70/30) (70-30) 100 UNIT/ML injection Inject 25 Units into the skin daily.   Yes Historical Provider, MD  losartan (COZAAR) 100 MG tablet Take 1 tablet (100 mg total) by mouth daily. 01/31/15  Yes Steele Sizer, MD  metFORMIN (GLUCOPHAGE) 500 MG tablet Take 1,000 mg by mouth 2 (two) times daily with a meal.   Yes Historical Provider, MD  oxyCODONE-acetaminophen (PERCOCET) 5-325 MG tablet Take 1 tablet by mouth every 4 (four) hours as needed for severe pain. 07/23/15  Yes Tommi Rumps, PA-C  sulfamethoxazole-trimethoprim (BACTRIM DS,SEPTRA DS) 800-160 MG tablet Take 1 tablet by mouth 2 (two) times daily. 07/23/15  Yes Tommi Rumps, PA-C  tamsulosin (FLOMAX) 0.4 MG CAPS capsule Take 1 capsule (0.4 mg total) by mouth daily. 07/18/15  Yes Steele Sizer, MD      VITAL SIGNS:  Blood pressure 88/64, pulse 91, temperature 98 F (36.7 C), temperature source Oral, resp. rate 12, height 6\' 1"  (1.854 m), weight 141.664 kg (312 lb 5 oz), SpO2 97 %.  PHYSICAL EXAMINATION:  Physical Exam  GENERAL:  60 y.o.-year-old patient lying in the bed lethargic and in mild distress.  EYES: Pupils equal, round, reactive to light and accommodation. No scleral icterus. Extraocular muscles intact.  HEENT: Head atraumatic, normocephalic. Oropharynx and nasopharynx clear. No oropharyngeal erythema, moist oral mucosa  NECK:  Supple, no jugular venous distention. No thyroid enlargement, no tenderness.  LUNGS: Poor Resp. effort, no wheezing, rales,  rhonchi. No use of accessory muscles of respiration.  CARDIOVASCULAR: S1, S2 RRR. No murmurs, rubs, gallops, clicks.  ABDOMEN: Soft, nontender, nondistended. Bowel sounds present. No organomegaly or mass.  EXTREMITIES: No pedal edema, cyanosis, or clubbing. + 2 pedal & radial pulses b/l.  Left ankle swelling with increasing warmth and tender to touch. NEUROLOGIC: Cranial nerves II through XII are intact. No focal Motor or sensory deficits appreciated b/l. Globally Weak PSYCHIATRIC: The patient is alert and oriented x 3. Good affect.  SKIN: No obvious rash, lesion, or ulcer.   LABORATORY PANEL:   CBC  Recent Labs Lab 07/25/15 1248  WBC 17.1*  HGB 13.6  HCT 41.5  PLT 149*   ------------------------------------------------------------------------------------------------------------------  Chemistries   Recent Labs Lab 07/25/15 1248  NA 124*  K 3.7  CL 90*  CO2 20*  GLUCOSE 452*  BUN 40*  CREATININE 1.09  CALCIUM 8.1*  AST 13*  ALT 26  ALKPHOS 97  BILITOT 1.3*   ------------------------------------------------------------------------------------------------------------------  Cardiac Enzymes  Recent Labs Lab 07/25/15 1248  TROPONINI 0.12*   ------------------------------------------------------------------------------------------------------------------  RADIOLOGY:  Dg Chest Port 1  View  07/25/2015  CLINICAL DATA:  Worsening shortness of breath and right shoulder pain since a fall 07/21/2015. Initial encounter. EXAM: PORTABLE CHEST 1 VIEW COMPARISON:  None. FINDINGS: The lungs are clear. Heart size is normal. No pneumothorax or pleural effusion. No focal bony abnormality. Acromioclavicular degenerative change is noted. IMPRESSION: No acute disease. Electronically Signed   By: Drusilla Kannerhomas  Dalessio M.D.   On: 07/25/2015 13:32     IMPRESSION AND PLAN:   60 year old male with past medical history of diabetes, hypertension, obesity, obstructive sleep apnea, history of  gout, BPH who presents to the hospital due to left ankle and right shoulder pain and just not feeling well and noted to be hypotensive.  #1 shock-unclear this is hypovolemic versus septic shock. The source of sepsis is currently unclear. -I will aggressively hydrate the patient with IV fluids, follow hemodynamics. -We'll follow blood, urine cultures. He was recently diagnosed with a UTI but his urinalysis is not grossly positive for UTI presently. -I will also empirically given vancomycin, Zosyn.  #2 left lower extremity pain and swelling-etiology unclear. Cellulitis versus acute gout -Continue empiric IV antibiotics. I will check a uric acid level. Continue allopurinol. -I will get a rheumatology consult and patient will likely need a arthrocentesis for further diagnosis of gout. -Continue supportive care for now. -I will also get Dopplers of the left lower extremity to rule out underlying DVT.  #3 hyponatremia-likely secondary to dehydration and poor by mouth intake. -I will continue IV fluids and follow sodium. Hold HCTZ.  #4 diabetes type 2 without complication-I'll place the patient on sliding scale insulin and follow blood sugars.  #5 hypertension-hold all antihypertensives given his hypotension.  #6 hyperlipidemia-continue atorvastatin.  #7 elevated troponin-she was noted to have elevated troponin 0.12. His EKG shows a new right bundle branch block. He has atypical chest pain which I think is musculoskeletal. Should I will observe him on telemetry, follow serial cardiac markers. Get a cardiology consult, check a two-dimensional echocardiogram.  All the records are reviewed and case discussed with ED provider. Management plans discussed with the patient, family and they are in agreement.  CODE STATUS: Full  TOTAL Critical Care TIME TAKING CARE OF THIS PATIENT: 50 minutes.    Houston SirenSAINANI,VIVEK J M.D on 07/25/2015 at 4:10 PM  Between 7am to 6pm - Pager - [phone]  After 6pm go  to [email or web address] - password [email or web address] [facility]  [facility]  Office  [phone]  CC: Primary care physician; Vonita MossMark Crissman, MD

## 2015-07-25 NOTE — Consult Note (Signed)
ANTIBIOTIC CONSULT NOTE - INITIAL  Pharmacy Consult for zosyn and vancomycin Indication: rule out sepsis  No Known Allergies  Patient Measurements: Height: 6\' 1"  (185.4 cm) Weight: (!) 312 lb 5 oz (141.664 kg) IBW/kg (Calculated) : 79.9 Adjusted Body Weight:   Vital Signs: Temp: 98 F (36.7 C) (01/11 1243) Temp Source: Oral (01/11 1243) BP: 82/49 mmHg (01/11 1330) Pulse Rate: 94 (01/11 1330) Intake/Output from previous day:   Intake/Output from this shift:    Labs:  Recent Labs  07/23/15 1428 07/25/15 1248  WBC 13.3* 17.1*  HGB 14.7 13.6  PLT 134* 149*  CREATININE 1.03 1.09   Estimated Creatinine Clearance: 110.6 mL/min (by C-G formula based on Cr of 1.09). No results for input(s): VANCOTROUGH, VANCOPEAK, VANCORANDOM, GENTTROUGH, GENTPEAK, GENTRANDOM, TOBRATROUGH, TOBRAPEAK, TOBRARND, AMIKACINPEAK, AMIKACINTROU, AMIKACIN in the last 72 hours.   Microbiology: No results found for this or any previous visit (from the past 720 hour(s)).  Medical History: Past Medical History  Diagnosis Date  . Chronic gouty arthropathy   . Glaucoma   . Hyperlipidemia   . Hypertension   . Rhinitis medicamentosa   . Diabetes mellitus without complication (HCC)   . Morbid obesity (HCC)   . UTI (urinary tract infection)     Medications:  Scheduled:  . aspirin       Assessment: Pt is a 60 year old morbidly obese man who presents with generalized weakness, hypotension, poorly controlled blood sugars and feet and shoulder pain. Pt is on antibiotics for a UTI dx 2 days ago. Pharmacy consulted to dose zosyn and vancomycin for possible sepsis.  Goal of Therapy:  Vancomycin trough level 15-20 mcg/ml  Plan:  Measure antibiotic drug levels at steady state Follow up culture results zosyn 3.375g q 8 hours EI dosing. Pt recieved 1g of vancomycin in the ED. Will give another 1 g for a total of 2g load. Wiill start vancomycin 1500mg  q 12 hours. Will check trough at stready state 1/14 @  0230.   Pharmacy will continue to monitor. Thank you for the consult.  Iriana Artley D Chaim Gatley 07/25/2015,1:56 PM

## 2015-07-25 NOTE — ED Provider Notes (Addendum)
[facility] Emergency Department Provider Note  ____________________________________________  Time seen: Approximately 12:39 PM  I have reviewed the triage vital signs and the nursing notes.   HISTORY  Chief Complaint Foot Pain; Shoulder Pain; and Urinary Tract Infection    HPI Edward Myers is a 60 y.o. male with a history that includes morbid obesity, poorly controlled insulin-dependent diabetes, hypertension, hyperlipidemia, gout, and a recent visit for musculoskeletal pain after a fall involving his feet, his right shoulder, and also diagnosis of a urinary tract infection for which she is taking antibiotics.  He presents today by EMS for generalized weakness and inability to walk, significantly worse pain in bilateral feet but much worse on the left, and hypotension for EMS as low as 80s over 60s.He is diaphoretic and his blood sugars reading too high to measure on the glucometer in spite of taking his insulin today.  He is still having dysuria which she describes as moderate to severe and there is concern that he may be septic from a partially treated urinary tract infection.  His foot pain is severe and worsened by any movement or touching of the affected foot.  Overall symptoms are described as severe.  He received approximately 300 mL of normal saline prior to arrival and his blood pressure is slightly improved to 102/90, but this is still hypotensive for this patient.  He is borderline tachycardic at about 95-10 5 bpm.  He is ill-appearing upon arrival.   Past Medical History  Diagnosis Date  . Chronic gouty arthropathy   . Glaucoma   . Hyperlipidemia   . Hypertension   . Rhinitis medicamentosa   . Diabetes mellitus without complication (HCC)   . Morbid obesity (HCC)   . UTI (urinary tract infection)     Patient Active Problem List   Diagnosis Date Noted  . Hyperlipidemia   . Hypertension   . Chronic gouty arthropathy   . Diabetes mellitus  without complication [facility])     Past Surgical History  Procedure Laterality Date  . Tonsillectomy    . Circumcision      Current Outpatient Rx  Name  Route  Sig  Dispense  Refill  . allopurinol (ZYLOPRIM) 100 MG tablet   Oral   Take 100 mg by mouth daily.          Marland Kitchen amLODipine (NORVASC) 10 MG tablet   Oral   Take 1 tablet (10 mg total) by mouth daily.   90 tablet   1   . aspirin EC 81 MG tablet   Oral   Take 81 mg by mouth daily.         Marland Kitchen atorvastatin (LIPITOR) 80 MG tablet   Oral   Take 80 mg by mouth daily.         . fexofenadine-pseudoephedrine (ALLEGRA-D 24) 180-240 MG 24 hr tablet   Oral   Take 1 tablet by mouth daily as needed (for allergies).         . fluticasone (FLONASE) 50 MCG/ACT nasal spray   Each Nare   Place 2 sprays into both nostrils daily as needed for rhinitis.          . hydrochlorothiazide (HYDRODIURIL) 25 MG tablet   Oral   Take 25 mg by mouth daily.         . insulin aspart protamine- aspart (NOVOLOG MIX 70/30) (70-30) 100 UNIT/ML injection   Subcutaneous   Inject 25 Units into the skin daily.         Marland Kitchen  losartan (COZAAR) 100 MG tablet   Oral   Take 1 tablet (100 mg total) by mouth daily.   90 tablet   1   . metFORMIN (GLUCOPHAGE) 500 MG tablet   Oral   Take 1,000 mg by mouth 2 (two) times daily with a meal.         . oxyCODONE-acetaminophen (PERCOCET) 5-325 MG tablet   Oral   Take 1 tablet by mouth every 4 (four) hours as needed for severe pain.   20 tablet   0   . sulfamethoxazole-trimethoprim (BACTRIM DS,SEPTRA DS) 800-160 MG tablet   Oral   Take 1 tablet by mouth 2 (two) times daily.   20 tablet   0   . tamsulosin (FLOMAX) 0.4 MG CAPS capsule   Oral   Take 1 capsule (0.4 mg total) by mouth daily.   30 capsule   4     Allergies Review of patient's allergies indicates no known allergies.  Family History  Problem Relation Age of Onset  . Heart attack Mother   . Diabetes Mother     Social  History Social History  Substance Use Topics  . Smoking status: Former Smoker    Types: Cigars    Quit date: 01/25/2010  . Smokeless tobacco: None  . Alcohol Use: Yes    Review of Systems Constitutional: Subjective fever/chills.  Generalized weakness, unable to ambulate Eyes: No visual changes. ENT: No sore throat. Cardiovascular: Denies chest pain. Respiratory: Increased work of breathing but patient denies any shortness of breath, cough, or wheezing Gastrointestinal: No abdominal pain.  nausea, no vomiting.  No diarrhea.  No constipation. Genitourinary: Positive for dysuria in spite of outpatient antibiotics Musculoskeletal: Negative for back pain.  Severe pain in bilateral feet but much worse on the left with some swelling and warmth.  Persistent right shoulder pain after a recent fall. Skin: Negative for rash. Neurological: Negative for headaches, focal weakness or numbness.  10-point ROS otherwise negative.  ____________________________________________   PHYSICAL EXAM:  ED Triage Vitals  Enc Vitals Group     BP 07/25/15 1243 102/90 mmHg     Pulse --      Resp 07/25/15 1239 17     Temp 07/25/15 1243 98 F (36.7 C)     Temp Source 07/25/15 1243 Oral     SpO2 07/25/15 1243 96 %     Weight 07/25/15 1239 295 lb (133.811 kg)     Height 07/25/15 1239 6\' 1"  (1.854 m)     Head Cir --      Peak Flow --      Pain Score 07/25/15 1240 10     Pain Loc --      Pain Edu? --      Excl. in GC? --     Constitutional: Alert and oriented. Ill-appearing in mild to moderate distress Eyes: Conjunctivae are normal. PERRL. EOMI. Head: Atraumatic. Nose: No congestion/rhinnorhea. Mouth/Throat: Mucous membranes are moist.  Oropharynx non-erythematous. Neck: No stridor.   Cardiovascular: Mild tachycardia, regular rhythm. Grossly normal heart sounds.  Good peripheral circulation. Respiratory: Normal respiratory effort.  No retractions. Lungs CTAB. Gastrointestinal: Obese, Soft and  nontender. No distention. No abdominal bruits. No CVA tenderness. Musculoskeletal: Swelling of bilateral feet with tenderness to palpation of both but the tenderness is severe on the left.  No obvious cellulitis or joint effusions. Neurologic:  Normal speech and language. No gross focal neurologic deficits are appreciated.  Skin:  Skin is warm, dry and intact. No rash noted.  Psychiatric: Mood and affect are normal. Speech and behavior are normal.  ____________________________________________   LABS (all labs ordered are listed, but only abnormal results are displayed)  Labs Reviewed  COMPREHENSIVE METABOLIC PANEL - Abnormal; Notable for the following:    Sodium 124 (*)    Chloride 90 (*)    CO2 20 (*)    Glucose, Bld 452 (*)    BUN 40 (*)    Calcium 8.1 (*)    Total Protein 6.4 (*)    Albumin 2.9 (*)    AST 13 (*)    Total Bilirubin 1.3 (*)    All other components within normal limits  TROPONIN I - Abnormal; Notable for the following:    Troponin I 0.12 (*)    All other components within normal limits  CBC WITH DIFFERENTIAL/PLATELET - Abnormal; Notable for the following:    WBC 17.1 (*)    Platelets 149 (*)    Neutro Abs 14.2 (*)    Monocytes Absolute 1.8 (*)    All other components within normal limits  BLOOD GAS, ARTERIAL - Abnormal; Notable for the following:    pH, Arterial 7.47 (*)    pCO2 arterial 29 (*)    pO2, Arterial 68 (*)    Allens test (pass/fail) POSITIVE (*)    All other components within normal limits  PROTIME-INR - Abnormal; Notable for the following:    Prothrombin Time 16.5 (*)    All other components within normal limits  URINALYSIS COMPLETEWITH MICROSCOPIC ([facility] ONLY) - Abnormal; Notable for the following:    Color, Urine YELLOW (*)    APPearance CLEAR (*)    Glucose, UA >500 (*)    Ketones, ur 1+ (*)    Hgb urine dipstick 1+ (*)    Leukocytes, UA TRACE (*)    Squamous Epithelial / LPF 0-5 (*)    All other components within normal limits  URIC  ACID - Abnormal; Notable for the following:    Uric Acid, Serum 9.4 (*)    All other components within normal limits  CULTURE, BLOOD (ROUTINE X 2)  CULTURE, BLOOD (ROUTINE X 2)  URINE CULTURE  LACTIC ACID, PLASMA  LIPASE, BLOOD  APTT  LACTIC ACID, PLASMA  HEPARIN LEVEL (UNFRACTIONATED)   ____________________________________________  EKG  ED ECG REPORT I, Zafira Munos, the attending physician, personally viewed and interpreted this ECG.   Date: 07/25/2015  EKG Time: 12:47  Rate: 94  Rhythm: normal sinus rhythm  Axis: Normal  Intervals:nonspecific intraventricular conduction delay, widened QRS.  ST&T Change: Non-specific ST segment / T-wave changes, but does not meet criteria for STEMI. Right bundle branch block and non-specific intraventricular conduction delay since prior in 2010  EKG #2 -- See below  ____________________________________________  RADIOLOGY   Dg Chest Port 1 View  07/25/2015  CLINICAL DATA:  Worsening shortness of breath and right shoulder pain since a fall 07/21/2015. Initial encounter. EXAM: PORTABLE CHEST 1 VIEW COMPARISON:  None. FINDINGS: The lungs are clear. Heart size is normal. No pneumothorax or pleural effusion. No focal bony abnormality. Acromioclavicular degenerative change is noted. IMPRESSION: No acute disease. Electronically Signed   By: Drusilla Kanner M.D.   On: 07/25/2015 13:32    ____________________________________________   PROCEDURES  Procedure(s) performed: None  Critical Care performed: Yes, see critical care note(s)   CRITICAL CARE Performed by: Loleta Rose   Total critical care time: 75 minutes  Critical care time was exclusive of separately billable procedures and treating other patients.  Critical care  was necessary to treat or prevent imminent or life-threatening deterioration.  Critical care was time spent personally by me on the following activities: development of treatment plan with patient and/or surrogate  as well as nursing, discussions with consultants, evaluation of patient's response to treatment, examination of patient, obtaining history from patient or surrogate, ordering and performing treatments and interventions, ordering and review of laboratory studies, ordering and review of radiographic studies, pulse oximetry and re-evaluation of patient's condition.  ____________________________________________   INITIAL IMPRESSION / ASSESSMENT AND PLAN / ED COURSE  Pertinent labs & imaging results that were available during my care of the patient were reviewed by me and considered in my medical decision making (see chart for details).  Initiating sepsis protocol (empiric abx after cultures, 70mL/kg of fluids until not hypotensive or lactate normal), broad general evaluation, reassessment pending.  ----------------------------------------- 1:37 PM on 07/25/2015 -----------------------------------------  Samples/cultures obtained, +leukocytosis, chemistries pending.  BP dropping as low as 76/47, now 80s/40s. Still alert and oriented, ill-appearing.  Still no CP and no SOB.  Receiving 2 L NS via 2 large bore PIVs and getting Zosyn empirically.  Paged on-call cardiologist (Dr. Juliann Pares) for his evaluation of the patient's abnormal EKG.  ----------------------------------------- 1:52 PM on 07/25/2015 -----------------------------------------  Spoke by phone with Dr. Juliann Pares a few minutes ago.  He is coming to the emergency department to evaluate the patient's EKG.  ----------------------------------------- 1:56 PM on 07/25/2015 -----------------------------------------  The patient is looking less like sepsis and more like possible cardiac etiology.  Lactate is 1.9, PO2 on ABG is 69, chest x-ray normal, troponin 0.12.  I am re-paging Dr. Juliann Pares given my increasing concern for a cardiac etiology.  Patient is also hyponatremic at 124 but I need to continue aggressive IV fluids given his  hypotension.   ----------------------------------------- 3:22 PM on 07/25/2015 ----------------------------------------- (Note that documentation was delayed due to multiple ED patients requiring immediate care.)  While I was coding another patient, this patient reported developing some central chest pain.  An EKG was repeated and the results are listed below:  ED ECG REPORT #2 I, Nikolaus Pienta, the attending physician, personally viewed and interpreted this ECG.   Date: 07/25/2015  EKG Time: 14:32  Rate: 91  Rhythm: normal sinus rhythm  Axis: Normal  Intervals:nonspecific intraventricular conduction delay, widened QRS.  ST&T Change: Non-specific ST segment / T-wave changes, but does not meet criteria for STEMI. Right bundle branch block and non-specific intraventricular conduction delay since prior in 2010, but unchanged since prior ECG earlier today.   Disposition (but not treatment) was delayed due to another critical case in the emergency department.  There is no clear source of sepsis but I am concerned about bacteremia given his septic presentation without an obvious source.  There is no sign or indication of septic joint either in his foot or his right shoulder, I find it much more likely that he is having a gout flare on top of sepsis.  His uric acid level is elevated at nearly 10.)  There is no effusion in either joint amenable to arthrocentesis.  The patient is still ill-appearing but his EKG is unchanged.  Dr. Juliann Pares evaluated the patient's EKG in the emergency department and felt that though it is grossly abnormal it is not indicative of a STEMI and the patient is not a candidate for catheterization at this time.  The patient has received heparin,empiric broad spectrum antibiotics, is still in the process of receiving aggressive hydration of 30 mL/kg of fluid per protocol (  the patient has a large habitus and this is a lengthy process), and is being admitted to the ICU for further  management.  He is not in need of pressors at this time as his MAP remains greater than 70 and usually around 75.  He remains AOx3.  Initially there was concern for no ICU bed available and I contacted [facility] and then [facility] about transfer, but during the course of multiple phone calls an ICU bed opened up at [facility].  I discussed again with the patient as family and they are in agreement that he should stay at [facility] at this time.  ____________________________________________  FINAL CLINICAL IMPRESSION(S) / ED DIAGNOSES  Final diagnoses:  Sepsis, due to unspecified organism (HCC)  Hypotension, unspecified hypotension type  Type 2 diabetes mellitus with hyperglycemia, with long-term current use of insulin (HCC)  Acute gouty arthropathy  NSTEMI (non-ST elevated myocardial infarction) (HCC)      NEW MEDICATIONS STARTED DURING THIS VISIT:  New Prescriptions   No medications on file     Loleta Rose, MD 07/25/15 1537  Addendum to correct several spelling/dictation errors.  Loleta Rose, MD 07/26/15 [phone]

## 2015-07-25 NOTE — Progress Notes (Signed)
Pt arrived from ED. Family insists staying at bedside. Pt still complaining of shoulder pain, medicated.

## 2015-07-25 NOTE — ED Notes (Addendum)
Pt arrived via EMS for complaints of foot and shoulder pain. Pt was seen Monday and diagnosed with UTI. Pt reports mild SOB. EMS reports 91/57 and EMS administered 300 mL NS via 18G left hand. EMS 104/52, 95% RA, CBG too high to read.

## 2015-07-25 NOTE — ED Notes (Signed)
Pt placed in trendelenburg position.

## 2015-07-25 NOTE — Progress Notes (Signed)
eLink Physician-Brief Progress Note Patient Name: Edward Myers Keith Ayer DOB: 11/04/1955 MRN: [id number]   Date of Service  07/25/2015  HPI/Events of Note  60 yo male with PMH of Gout, HTN, HLD, DM - Type II, Morbid Obesity and Glaucoma. Presents to ED with hypotension which is now improved to 98/76 with fluid resuscitation, troponin = 0.12 and urinary tract infection. Current medical regimen includes Allopurinol, ASA, Lipitor, Claritin, Flomax, Heparin IV infusion, Vancomycin and Zosyn. Afebrile, BP = 98/76 and HR = 80. O2 sat on 2.0 L/min  O2 with sat = 100% and RR = 17. Management per Hospitalist Group.   eICU Interventions  Continue current management.      Intervention Category Evaluation Type: New Patient Evaluation  [name],Nava Song Eugene 07/25/2015, 10:16 PM

## 2015-07-25 NOTE — Progress Notes (Signed)
ANTICOAGULATION CONSULT NOTE - Initial Consult  Pharmacy Consult for Heparin Indication: chest pain/ACS  No Known Allergies  Patient Measurements: Height: 6\' 1"  (185.4 cm) Weight: (!) 312 lb 5 oz (141.664 kg) IBW/kg (Calculated) : 79.9 Heparin Dosing Weight: 110  Vital Signs: Temp: 98 F (36.7 C) (01/11 1243) Temp Source: Oral (01/11 1243) BP: 93/59 mmHg (01/11 1500) Pulse Rate: 90 (01/11 1500)  Labs:  Recent Labs  07/23/15 1428 07/25/15 1248  HGB 14.7 13.6  HCT 44.2 41.5  PLT 134* 149*  APTT  --  28  LABPROT  --  16.5*  INR  --  1.32  CREATININE 1.03 1.09  TROPONINI  --  0.12*    Estimated Creatinine Clearance: 110.6 mL/min (by C-G formula based on Cr of 1.09).   Medical History: Past Medical History  Diagnosis Date  . Chronic gouty arthropathy   . Glaucoma   . Hyperlipidemia   . Hypertension   . Rhinitis medicamentosa   . Diabetes mellitus without complication (HCC)   . Morbid obesity (HCC)   . UTI (urinary tract infection)     Medications:  Scheduled:  . aspirin      . heparin  4,000 Units Intravenous Once   Infusions:  . heparin 1,450 Units/hr (07/25/15 1430)  . piperacillin-tazobactam    . sodium chloride 1,000 mL (07/25/15 1258)  . vancomycin      Assessment: 60 y/o M admitted with foot and shoulder pain started on heparin drip for possible NSTEMI.   Goal of Therapy:  Heparin level 0.3-0.7 units/ml Monitor platelets by anticoagulation protocol: Yes   Plan:  Give 4000 units bolus x 1 Start heparin infusion at 1450 units/hr Check anti-Xa level in 6 hours and daily while on heparin Continue to monitor H&H and platelets  Luisa HartChristy, Arshad Oberholzer D 07/25/2015,3:13 PM

## 2015-07-26 ENCOUNTER — Inpatient Hospital Stay: Payer: BLUE CROSS/BLUE SHIELD

## 2015-07-26 ENCOUNTER — Inpatient Hospital Stay
Admit: 2015-07-26 | Discharge: 2015-07-26 | Disposition: A | Discharge status: Home / Self Care | Payer: BLUE CROSS/BLUE SHIELD | Attending: Specialist | Admitting: Specialist

## 2015-07-26 DIAGNOSIS — I248 Other forms of acute ischemic heart disease: Secondary | ICD-10-CM | POA: Diagnosis present

## 2015-07-26 LAB — URINE CULTURE: Culture: NO GROWTH

## 2015-07-26 LAB — BASIC METABOLIC PANEL
Anion gap: 3 — ABNORMAL LOW (ref 5–15)
BUN: 35 mg/dL — AB (ref 6–20)
CHLORIDE: 101 mmol/L (ref 101–111)
CO2: 27 mmol/L (ref 22–32)
CREATININE: 1.06 mg/dL (ref 0.61–1.24)
Calcium: 7.6 mg/dL — ABNORMAL LOW (ref 8.9–10.3)
GFR calc Af Amer: >60 mL/min (ref >60)
GFR calc non Af Amer: >60 mL/min (ref >60)
Glucose, Bld: 326 mg/dL — ABNORMAL HIGH (ref 65–99)
Potassium: 3.1 mmol/L — ABNORMAL LOW (ref 3.5–5.1)
SODIUM: 131 mmol/L — AB (ref 135–145)

## 2015-07-26 LAB — CBC
HCT: 37.3 % — ABNORMAL LOW (ref 40.0–52.0)
HEMOGLOBIN: 12.3 g/dL — AB (ref 13.0–18.0)
MCH: 26.7 pg (ref 26.0–34.0)
MCHC: 33.1 g/dL (ref 32.0–36.0)
MCV: 80.6 fL (ref 80.0–100.0)
Platelets: 129 10*3/uL — ABNORMAL LOW (ref 150–440)
RBC: 4.62 MIL/uL (ref 4.40–5.90)
RDW: 13.7 % (ref 11.5–14.5)
WBC: 12.3 10*3/uL — ABNORMAL HIGH (ref 3.8–10.6)

## 2015-07-26 LAB — GLUCOSE, CAPILLARY
GLUCOSE-CAPILLARY: 216 mg/dL — AB (ref 65–99)
GLUCOSE-CAPILLARY: 291 mg/dL — AB (ref 65–99)
GLUCOSE-CAPILLARY: 325 mg/dL — AB (ref 65–99)
GLUCOSE-CAPILLARY: 325 mg/dL — AB (ref 65–99)
Glucose-Capillary: 302 mg/dL — ABNORMAL HIGH (ref 65–99)
Glucose-Capillary: 380 mg/dL — ABNORMAL HIGH (ref 65–99)
Glucose-Capillary: 348 mg/dL — ABNORMAL HIGH (ref 65–99)
Glucose-Capillary: 356 mg/dL — ABNORMAL HIGH (ref 65–99)
Glucose-Capillary: 371 mg/dL — ABNORMAL HIGH (ref 65–99)
Glucose-Capillary: 298 mg/dL — ABNORMAL HIGH (ref 65–99)
Glucose-Capillary: 241 mg/dL — ABNORMAL HIGH (ref 65–99)

## 2015-07-26 LAB — PHOSPHORUS: PHOSPHORUS: 2.8 mg/dL (ref 2.5–4.6)

## 2015-07-26 LAB — TROPONIN I: TROPONIN I: 0.08 ng/mL — AB (ref <0.031)

## 2015-07-26 LAB — HEPARIN LEVEL (UNFRACTIONATED): Heparin Unfractionated: 0.51 IU/mL (ref 0.30–0.70)

## 2015-07-26 LAB — MAGNESIUM: Magnesium: 2.6 mg/dL — ABNORMAL HIGH (ref 1.7–2.4)

## 2015-07-26 MED ORDER — VANCOMYCIN HCL 10 G IV SOLR
1500.0000 mg | Freq: Two times a day (BID) | INTRAVENOUS | Status: DC
  Administered 2015-07-26 – 2015-07-27 (×2): 1500 mg via INTRAVENOUS
  Filled 2015-07-26 (×3): qty 1500

## 2015-07-26 MED ORDER — MORPHINE SULFATE (PF) 4 MG/ML IV SOLN
4.0000 mg | INTRAVENOUS | Status: DC | PRN
  Administered 2015-07-26 – 2015-07-30 (×9): 4 mg via INTRAVENOUS
  Filled 2015-07-26 (×12): qty 1

## 2015-07-26 MED ORDER — DEXTROSE-NACL 5-0.45 % IV SOLN
INTRAVENOUS | Status: DC
  Administered 2015-07-26: 23:00:00 via INTRAVENOUS

## 2015-07-26 MED ORDER — SODIUM CHLORIDE 0.9 % IV SOLN
INTRAVENOUS | Status: DC
  Administered 2015-07-26: 3.2 [IU]/h via INTRAVENOUS
  Filled 2015-07-26: qty 2.5

## 2015-07-26 MED ORDER — POTASSIUM CHLORIDE 20 MEQ PO PACK
40.0000 meq | PACK | Freq: Once | ORAL | Status: AC
  Administered 2015-07-26: 40 meq via ORAL
  Filled 2015-07-26: qty 2

## 2015-07-26 MED ORDER — COLCHICINE 0.6 MG PO TABS
0.6000 mg | ORAL_TABLET | Freq: Once | ORAL | Status: AC
  Administered 2015-07-26: 0.6 mg via ORAL
  Filled 2015-07-26: qty 1

## 2015-07-26 MED ORDER — INSULIN REGULAR BOLUS VIA INFUSION
0.0000 [IU] | Freq: Three times a day (TID) | INTRAVENOUS | Status: DC
  Filled 2015-07-26: qty 10

## 2015-07-26 MED ORDER — DEXTROSE 50 % IV SOLN: 25.0000 mL | INTRAVENOUS | Status: DC | PRN

## 2015-07-26 MED ORDER — ENOXAPARIN SODIUM 40 MG/0.4ML ~~LOC~~ SOLN
40.0000 mg | Freq: Two times a day (BID) | SUBCUTANEOUS | Status: DC
  Administered 2015-07-26 – 2015-07-31 (×10): 40 mg via SUBCUTANEOUS
  Filled 2015-07-26 (×11): qty 0.4

## 2015-07-26 MED ORDER — METHYLPREDNISOLONE SODIUM SUCC 125 MG IJ SOLR
60.0000 mg | Freq: Once | INTRAMUSCULAR | Status: AC
  Administered 2015-07-26: 60 mg via INTRAVENOUS
  Filled 2015-07-26: qty 2

## 2015-07-26 MED ORDER — SODIUM CHLORIDE 0.9 % IV SOLN
INTRAVENOUS | Status: DC
Start: 2015-07-26 — End: 2015-07-26

## 2015-07-26 NOTE — Consult Note (Signed)
ANTIBIOTIC CONSULT NOTE - FOLLOW UP  Pharmacy Consult for zosyn and vancomycin Indication: rule out sepsis  No Known Allergies  Patient Measurements: Height: 6\' 1"  (185.4 cm) Weight: (!) 312 lb 5 oz (141.664 kg) IBW/kg (Calculated) : 79.9 Adjusted Body Weight:   Vital Signs: Temp: 98.4 F (36.9 C) (01/12 1200) Temp Source: Oral (01/12 1200) BP: 110/74 mmHg (01/12 1100) Pulse Rate: 86 (01/12 1100) Intake/Output from previous day: 01/11 0701 - 01/12 0700 In: 2177.3 [I.V.:1527.3; IV Piggyback:650] Out: -  Intake/Output from this shift: Total I/O In: 413 [I.V.:413] Out: 650 [Urine:650]  Labs:  Recent Labs  07/23/15 1428 07/25/15 1248 07/26/15 0637  WBC 13.3* 17.1* 12.3*  HGB 14.7 13.6 12.3*  PLT 134* 149* 129*  CREATININE 1.03 1.09 1.06   Estimated Creatinine Clearance: 113.8 mL/min (by C-G formula based on Cr of 1.06). No results for input(s): VANCOTROUGH, VANCOPEAK, VANCORANDOM, GENTTROUGH, GENTPEAK, GENTRANDOM, TOBRATROUGH, TOBRAPEAK, TOBRARND, AMIKACINPEAK, AMIKACINTROU, AMIKACIN in the last 72 hours.   Microbiology: Recent Results (from the past 720 hour(s))  Urine culture     Status: None   Collection Time: 07/25/15 12:48 PM  Result Value Ref Range Status   Specimen Description URINE, RANDOM  Final   Special Requests NONE  Final   Culture NO GROWTH  Final   Report Status 07/26/2015 FINAL  Final  MRSA PCR Screening     Status: None   Collection Time: 07/25/15  8:45 PM  Result Value Ref Range Status   MRSA by PCR NEGATIVE NEGATIVE Final    Comment:        The GeneXpert MRSA Assay (FDA approved for NASAL specimens only), is one component of a comprehensive MRSA colonization surveillance program. It is not intended to diagnose MRSA infection nor to guide or monitor treatment for MRSA infections.     Medical History: Past Medical History  Diagnosis Date  . Chronic gouty arthropathy   . Glaucoma   . Hyperlipidemia   . Hypertension   . Rhinitis  medicamentosa   . Diabetes mellitus without complication (HCC)   . Morbid obesity (HCC)   . UTI (urinary tract infection)     Medications:  Scheduled:  . allopurinol  100 mg Oral Daily  . antiseptic oral rinse  7 mL Mouth Rinse BID  . aspirin EC  81 mg Oral Daily  . atorvastatin  80 mg Oral Daily  . enoxaparin (LOVENOX) injection  40 mg Subcutaneous Q12H  . insulin aspart  0-9 Units Subcutaneous 6 times per day  . insulin regular  0-10 Units Intravenous TID WC  . loratadine  10 mg Oral Daily  . piperacillin-tazobactam (ZOSYN)  IV  4.5 g Intravenous 3 times per day  . tamsulosin  0.4 mg Oral Daily  . vancomycin  1,500 mg Intravenous Q12H   Assessment: Pt is a 60 year old morbidly obese man who presents with generalized weakness, hypotension, poorly controlled blood sugars and feet and shoulder pain. Pt is on antibiotics for a UTI dx 2 days ago. Pharmacy consulted to dose zosyn and vancomycin for possible sepsis.  1/12: MD Wieting would like to resume Vancomycin for now until cultures result.   Goal of Therapy:  Vancomycin trough level 15-20 mcg/ml  Plan:  Measure antibiotic drug levels at steady state Follow up culture results zosyn 3.375g q 8 hours EI dosing. Pt recieved 1g of vancomycin in the ED. Will give another 1 g for a total of 2g load. Wiill start vancomycin 1500mg  q 12 hours. Will check  trough at stready state 1/14 @ 0230.    1/12: Will restart previous Vancomycin regimen of 1500 mg IV q12 hours. Trough level ordered for 1/14 @ 02:30.   Pharmacy will continue to monitor. Thank you for the consult.    Francois Elk D 07/26/2015,2:20 PM

## 2015-07-26 NOTE — Progress Notes (Addendum)
Pt oxygen saturation dropping due to mouth breathing. Now on 45% vm

## 2015-07-26 NOTE — Progress Notes (Signed)
Patient's wife in for visit and she says she is taking patient out of here today, [facility] as he was in the ER Monday and was sent home and now he's back and no one is saying what is wrong with him.

## 2015-07-26 NOTE — Progress Notes (Signed)
Echocardiogram 2D Echocardiogram has been performed.  Dorothey BasemanReel, Chabely Norby M 07/26/2015, 9:38 AM

## 2015-07-26 NOTE — Consult Note (Addendum)
[facility]  CARDIOLOGY CONSULT NOTE  Patient ID: Edward Myers MRN: [id number] DOB/AGE: [birth year] 60 y.o.  Admit date: 07/25/2015 Referring Physician Dr. Renae GlossWieting Primary Physician   Primary Cardiologist   Reason for Consultation abnormal troponin/sepsis  HPI:  Pt is a 60 yo male with no prior cardiac history who presented to the er with complaints of left ankle and right shoulder pain. He had a mechanical fall this past weekend causing trauma to his right shoulder. He was in the er several days ago and was noted to have a uti and was given oral bactrim. He became weaker and was noted to have a fever of 101. He was hypotensive in the er with sbp 70-80. He had a trivial troponin elevation to 0.12 on admission with subsequent value to 0.08. Ech o  Revealed preserved lv function with lvh and no regional wall motion abnormality. He denies any chest pain. Complains of sob and shoulder and leg pain. EKG showed no ischemia   ROS Review of Systems - History obtained from chart review and the patient General ROS: positive for  - fatigue, fever and right shoulder pain and left ankle pain Respiratory ROS: positive for - cough and shortness of breath Cardiovascular ROS: negative for - chest pain Gastrointestinal ROS: no abdominal pain, change in bowel habits, or black or bloody stools Musculoskeletal ROS: positive for - joint pain and muscle pain Neurological ROS: no TIA or stroke symptoms   Past Medical History  Diagnosis Date  . Chronic gouty arthropathy   . Glaucoma   . Hyperlipidemia   . Hypertension   . Rhinitis medicamentosa   . Diabetes mellitus without complication (HCC)   . Morbid obesity (HCC)   . UTI (urinary tract infection)     Family History  Problem Relation Age of Onset  . Heart attack Mother   . Diabetes Mother     Social History   Social History  . Marital Status: Married    Spouse Name: N/A  . Number of Children: N/A  .  Years of Education: N/A   Occupational History  . Not on file.   Social History Main Topics  . Smoking status: Former Smoker    Types: Cigars    Quit date: 01/25/2010  . Smokeless tobacco: Not on file  . Alcohol Use: Yes  . Drug Use: Not on file  . Sexual Activity: Not on file   Other Topics Concern  . Not on file   Social History Narrative    Past Surgical History  Procedure Laterality Date  . Tonsillectomy    . Circumcision       Prescriptions prior to admission  Medication Sig Dispense Refill Last Dose  . allopurinol (ZYLOPRIM) 100 MG tablet Take 100 mg by mouth daily.    07/25/2015 at Unknown time  . amLODipine (NORVASC) 10 MG tablet Take 1 tablet (10 mg total) by mouth daily. 90 tablet 1 07/25/2015 at Unknown time  . aspirin EC 81 MG tablet Take 81 mg by mouth daily.   07/25/2015 at 0815  . atorvastatin (LIPITOR) 80 MG tablet Take 80 mg by mouth daily.   07/25/2015 at Unknown time  . fexofenadine-pseudoephedrine (ALLEGRA-D 24) 180-240 MG 24 hr tablet Take 1 tablet by mouth daily as needed (for allergies).   Past Month at Unknown time  . fluticasone (FLONASE) 50 MCG/ACT nasal spray Place 2 sprays into both nostrils daily as needed for rhinitis.    Past Month at  Unknown time  . hydrochlorothiazide (HYDRODIURIL) 25 MG tablet Take 25 mg by mouth daily.   07/25/2015 at Unknown time  . insulin aspart protamine- aspart (NOVOLOG MIX 70/30) (70-30) 100 UNIT/ML injection Inject 25 Units into the skin daily.   07/25/2015 at Unknown time  . losartan (COZAAR) 100 MG tablet Take 1 tablet (100 mg total) by mouth daily. 90 tablet 1 07/25/2015 at Unknown time  . metFORMIN (GLUCOPHAGE) 500 MG tablet Take 1,000 mg by mouth 2 (two) times daily with a meal.   07/25/2015 at Unknown time  . oxyCODONE-acetaminophen (PERCOCET) 5-325 MG tablet Take 1 tablet by mouth every 4 (four) hours as needed for severe pain. 20 tablet 0 07/25/2015 at 0815  . sulfamethoxazole-trimethoprim (BACTRIM DS,SEPTRA DS) 800-160  MG tablet Take 1 tablet by mouth 2 (two) times daily. 20 tablet 0 07/25/2015 at Unknown time  . tamsulosin (FLOMAX) 0.4 MG CAPS capsule Take 1 capsule (0.4 mg total) by mouth daily. 30 capsule 4 07/25/2015 at Unknown time    Physical Exam: Blood pressure 110/74, pulse 86, temperature 98.4 F (36.9 C), temperature source Oral, resp. rate 21, height 6\' 1"  (1.854 m), weight 141.664 kg (312 lb 5 oz), SpO2 94 %.    General appearance: cooperative and somewhat somnolent but responsive Resp: clear to auscultation bilaterally Cardio: regular rate and rhythm GI: soft, non-tender; bowel sounds normal; no masses,  no organomegaly Extremities: edema 1+ Neurologic: Mental status: alertness: lethargic Labs:   Lab Results  Component Value Date   WBC 12.3* 07/26/2015   HGB 12.3* 07/26/2015   HCT 37.3* 07/26/2015   MCV 80.6 07/26/2015   PLT 129* 07/26/2015    Recent Labs Lab 07/25/15 1248 07/26/15 0637  NA 124* 131*  K 3.7 3.1*  CL 90* 101  CO2 20* 27  BUN 40* 35*  CREATININE 1.09 1.06  CALCIUM 8.1* 7.6*  PROT 6.4*  --   BILITOT 1.3*  --   ALKPHOS 97  --   ALT 26  --   AST 13*  --   GLUCOSE 452* 326*   Lab Results  Component Value Date   TROPONINI 0.08* 07/26/2015      Radiology: No acute disease on cxr EKG: sinus rhythm with rbbb  ASSESSMENT AND PLAN:  60 yo male with no prior cardiac disease who presented to the ere with right shoulder pain after a mechanical fall and left ankle pain which he feels is secondary to his gouty arthritis. He was nypotensive and febrile on presentation. He had been treated for a uti earlier in the week. He had a mild troponin elevation on presentation to 0.12 with subsequnt 0.08. No chest pain or ishcmeia on ekg. Echo showd normal ef with lvh and no regional wall motion abnormality. Does not appear to have had acute acs with nstemi. Appears to have had demand ischemia. Conitnue with hydrationi and treatment with emperic abx for sepsis. Defer any further  invasive or noninvasive cardiac work up for now.  Signed: Dalia Heading MD, [facility] 07/26/2015, 12:23 PM

## 2015-07-26 NOTE — Progress Notes (Signed)
Patient ID: Edward Myers, male   DOB: Nov 22, 1955, 60 y.o.   MRN: [id number] [facility] PROGRESS [facility]  Edward Myers WUJ:[id number] DOB: Nov 22, 1955 DOA: 07/25/2015 PCP: Vonita MossMark Crissman, MD  HPI/Subjective: Patient with severe pain in right shoulder and unable to move it. Also pain in the left ankle. Patient admitted to the CCU stepdown with hypotension and suspected sepsis. Patient still in severe pain and his main complaint is the pain control.  Objective: Filed Vitals:   07/26/15 0800 07/26/15 0900  BP:  92/70  Pulse: 87 81  Temp:    Resp: 20 18    Filed Weights   07/25/15 1239 07/25/15 1300  Weight: 133.811 kg (295 lb) 141.664 kg (312 lb 5 oz)    ROS: Review of Systems  Constitutional: Negative for fever and chills.  Eyes: Negative for blurred vision.  Respiratory: Negative for cough and shortness of breath.   Cardiovascular: Negative for chest pain.  Gastrointestinal: Negative for nausea, vomiting, abdominal pain, diarrhea and constipation.  Genitourinary: Negative for dysuria.  Musculoskeletal: Positive for joint pain.  Neurological: Negative for dizziness and headaches.   Exam: Physical Exam  Constitutional: He is oriented to person, place, and time.  HENT:  Right Ear: Tympanic membrane normal.  Left Ear: Tympanic membrane normal.  Nose: No mucosal edema.  Mouth/Throat: No oropharyngeal exudate or posterior oropharyngeal edema.  Eyes: Conjunctivae, EOM and lids are normal. Pupils are equal, round, and reactive to light.  Neck: Neck supple. No JVD present. Carotid bruit is not present. No tracheal deviation present. No thyroid mass and no thyromegaly present.  Cardiovascular: Regular rhythm, S2 normal and normal heart sounds.   No murmur heard. Pulses:      Carotid pulses are 2+ on the right side, and 2+ on the left side.      Dorsalis pedis pulses are 2+ on the right side, and 2+ on the left side.  Respiratory: No accessory muscle usage. No  respiratory distress. He has no wheezes. He has no rhonchi. He has no rales.  GI: Soft. Bowel sounds are normal. He exhibits no distension. There is no hepatosplenomegaly. There is no CVA tenderness.  Musculoskeletal:       Right shoulder: He exhibits decreased range of motion, tenderness, swelling and pain.       Left ankle: He exhibits decreased range of motion and swelling.  Lymphadenopathy:    He has no cervical adenopathy.    He has no axillary adenopathy.  Neurological: He is alert and oriented to person, place, and time. He has normal strength. No cranial nerve deficit or sensory deficit.  Reflex Scores:      Patellar reflexes are 2+ on the right side and 2+ on the left side. Skin: Skin is warm. Nails show no clubbing.  Chronic lower extremity discoloration. Slight erythema.  Psychiatric: He has a normal mood and affect.    Data Reviewed: Basic Metabolic Panel:  Recent Labs Lab 07/23/15 1428 07/25/15 1248 07/26/15 0637  NA 127* 124* 131*  K 3.8 3.7 3.1*  CL 93* 90* 101  CO2 20* 20* 27  GLUCOSE 436* 452* 326*  BUN 28* 40* 35*  CREATININE 1.03 1.09 1.06  CALCIUM 8.8* 8.1* 7.6*   Liver Function Tests:  Recent Labs Lab 07/23/15 1428 07/25/15 1248  AST 20 13*  ALT 36 26  ALKPHOS 86 97  BILITOT 2.1* 1.3*  PROT 6.9 6.4*  ALBUMIN 3.5 2.9*    Recent Labs Lab 07/25/15 1248  LIPASE 23  CBC:  Recent Labs Lab 07/23/15 1428 07/25/15 1248 07/26/15 0637  WBC 13.3* 17.1* 12.3*  NEUTROABS 11.6* 14.2*  --   HGB 14.7 13.6 12.3*  HCT 44.2 41.5 37.3*  MCV 80.8 81.6 80.6  PLT 134* 149* 129*   Cardiac Enzymes:  Recent Labs Lab 07/25/15 1248  TROPONINI 0.12*    CBG:  Recent Labs Lab 07/23/15 1721 07/25/15 2025 07/25/15 2321 07/26/15 0327 07/26/15 0725  GLUCAP 319* 387* 396* 302* 325*    Recent Results (from the past 240 hour(s))  Urine culture     Status: None   Collection Time: 07/25/15 12:48 PM  Result Value Ref Range Status   Specimen  Description URINE, RANDOM  Final   Special Requests NONE  Final   Culture NO GROWTH  Final   Report Status 07/26/2015 FINAL  Final  MRSA PCR Screening     Status: None   Collection Time: 07/25/15  8:45 PM  Result Value Ref Range Status   MRSA by PCR NEGATIVE NEGATIVE Final    Comment:        The GeneXpert MRSA Assay (FDA approved for NASAL specimens only), is one component of a comprehensive MRSA colonization surveillance program. It is not intended to diagnose MRSA infection nor to guide or monitor treatment for MRSA infections.      Studies: Dg Chest Port 1 View  07/25/2015  CLINICAL DATA:  Worsening shortness of breath and right shoulder pain since a fall 07/21/2015. Initial encounter. EXAM: PORTABLE CHEST 1 VIEW COMPARISON:  None. FINDINGS: The lungs are clear. Heart size is normal. No pneumothorax or pleural effusion. No focal bony abnormality. Acromioclavicular degenerative change is noted. IMPRESSION: No acute disease. Electronically Signed   By: Drusilla Kanner M.D.   On: 07/25/2015 13:32    Scheduled Meds: . allopurinol  100 mg Oral Daily  . antiseptic oral rinse  7 mL Mouth Rinse BID  . aspirin EC  81 mg Oral Daily  . atorvastatin  80 mg Oral Daily  . colchicine  0.6 mg Oral Once  . insulin aspart  0-9 Units Subcutaneous 6 times per day  . loratadine  10 mg Oral Daily  . methylPREDNISolone (SOLU-MEDROL) injection  60 mg Intravenous Once  . piperacillin-tazobactam (ZOSYN)  IV  4.5 g Intravenous 3 times per day  . pneumococcal 23 valent vaccine  0.5 mL Intramuscular Tomorrow-1000  . tamsulosin  0.4 mg Oral Daily  . vancomycin  1,500 mg Intravenous Q12H   Continuous Infusions: . sodium chloride 125 mL/hr at 07/26/15 0900    Assessment/Plan:  1. Shock, hypotension, leukocytosis and tachycardia. With severe right shoulder pain and left ankle pain- unclear if septic joint. With lower extremity erythema could be a cellulitis.- unclear if sepsis or not. Continue  aggressive antibiotics with vancomycin and Zosyn until cultures are back. All blood pressure medications were held 2. Right shoulder pain and left ankle pain- this could be polyarticular gout. I'll give 1 dose of Solu-Medrol and 1 dose of colchicine. Await rheumatology consultation which was called this morning. I'll get a CT scan of the right shoulder since the patient did have a fall. Previous x-rays were negative. 3. Uncontrolled diabetes with hyperglycemia- asked the nurse to put on hyperglycemia protocol and insulin drip 4. Hyponatremia- likely secondary to hydrochlorothiazide 5. Elevated troponin likely demand ischemia from hypotension- continue aspirin. Stop heparin drip just in case arthrocentesis needed. Serial troponins 6. Hyperlipidemia unspecified on atorvastatin 7. BPH on Flomax  Code Status:  Code Status Orders        Start     Ordered   07/25/15 2028  Full code   Continuous     07/25/15 2027    Code Status History    Date Active Date Inactive Code Status Order ID Comments User Context   This patient has a current code status but no historical code status.    Advance Directive Documentation        Most Recent Value   Type of Advance Directive  Living will   Pre-existing out of facility DNR order (yellow form or pink MOST form)     "MOST" Form in Place?       Family Communication: Spoke with wife at the bedside. They initially wanted to be transferred to [facility] because they felt nothing was being done. I explained the plan that I was sent forward and they agreed to stay here. Disposition Plan: To be determined  Consultants:  Rheumatology  Cardiology  Antibiotics:  Vancomycin  Zosyn  Time spent: 30 minutes  Alford Highland  [facility]

## 2015-07-26 NOTE — Progress Notes (Signed)
Inpatient Diabetes Program Recommendations  AACE/ADA: New Consensus Statement on Inpatient Glycemic Control (2015)  Target Ranges:  Prepandial:   less than 140 mg/dL      Peak postprandial:   less than 180 mg/dL (1-2 hours)      Critically ill patients:  140 - 180 mg/dL   Review of Glycemic Control  Results for Edward HatchRKER, Chis[place] KEITH (MRN [id number]) as of 07/26/2015 13:29  Ref. Range 07/25/2015 20:25 07/25/2015 23:21 07/26/2015 03:27 07/26/2015 07:25 07/26/2015 11:29  Glucose-Capillary Latest Ref Range: 65-99 mg/dL 409387 (H) 811396 (H) 914302 (H) 325 (H) 325 (H)    Diabetes history:Type 2 Outpatient Diabetes medications: Novolog mix 75/25 25 units daily, Metformin 1000mg  bid Current orders for Inpatient glycemic control: insulin glucostabilizer to be started  Inpatient Diabetes Program Recommendations: Spoke with RN caring for the patient- she is currently placing the order set (per Dr. Hilton SinclairWeiting) ; IV insulin Barnett Abu/Glucostabilizer and is transitioning patient to IV insulin   Susette RacerJulie Jowana Thumma, RN, [place], [place], CDE Diabetes Coordinator Inpatient Diabetes Program  [phone] (Team Pager) [phone] [facility] Office) 07/26/2015 1:32 PM

## 2015-07-26 NOTE — Progress Notes (Signed)
PHARMACY - CRITICAL CARE PROGRESS NOTE  Pharmacy Consult for Electrolyte Management    No Known Allergies  Patient Measurements: Height: 6\' 1"  (185.4 cm) Weight: (!) 312 lb 5 oz (141.664 kg) IBW/kg (Calculated) : 79.9   Vital Signs: Temp: 98.4 F (36.9 C) (01/12 2100) Temp Source: Oral (01/12 2100) BP: 103/79 mmHg (01/12 2300) Pulse Rate: 77 (01/12 2300) Intake/Output from previous day: 01/11 0701 - 01/12 0700 In: 2177.3 [I.V.:1527.3; IV Piggyback:650] Out: -  Intake/Output from this shift:   Vent settings for last 24 hours: Vent Mode:  [-]  FiO2 (%):  [28 %] 28 %  Labs:  Recent Labs  07/25/15 1248 07/26/15 0637  WBC 17.1* 12.3*  HGB 13.6 12.3*  HCT 41.5 37.3*  PLT 149* 129*  APTT 28  --   INR 1.32  --   CREATININE 1.09 1.06  MG  --  2.6*  PHOS  --  2.8  ALBUMIN 2.9*  --   PROT 6.4*  --   AST 13*  --   ALT 26  --   ALKPHOS 97  --   BILITOT 1.3*  --    Estimated Creatinine Clearance: 113.8 mL/min (by C-G formula based on Cr of 1.06).   Recent Labs  07/26/15 2019 07/26/15 2124 07/26/15 2228  GLUCAP 291* 298* 241*    Microbiology: Recent Results (from the past 720 hour(s))  Blood Culture (routine x 2)     Status: None (Preliminary result)   Collection Time: 07/25/15 12:48 PM  Result Value Ref Range Status   Specimen Description BLOOD LEFT HAND  Final   Special Requests   Final    BOTTLES DRAWN AEROBIC AND ANAEROBIC AEROBIC 14ML ANAEROBIC 11ML   Culture NO GROWTH 1 DAY  Final   Report Status PENDING  Incomplete  Urine culture     Status: None   Collection Time: 07/25/15 12:48 PM  Result Value Ref Range Status   Specimen Description URINE, RANDOM  Final   Special Requests NONE  Final   Culture NO GROWTH  Final   Report Status 07/26/2015 FINAL  Final  Blood Culture (routine x 2)     Status: None (Preliminary result)   Collection Time: 07/25/15  1:05 PM  Result Value Ref Range Status   Specimen Description BLOOD RIGHT FATTY CASTS  Final   Special Requests   Final    BOTTLES DRAWN AEROBIC AND ANAEROBIC ANA 5CC AERO 10CC   Culture NO GROWTH 1 DAY  Final   Report Status PENDING  Incomplete  MRSA PCR Screening     Status: None   Collection Time: 07/25/15  8:45 PM  Result Value Ref Range Status   MRSA by PCR NEGATIVE NEGATIVE Final    Comment:        The GeneXpert MRSA Assay (FDA approved for NASAL specimens only), is one component of a comprehensive MRSA colonization surveillance program. It is not intended to diagnose MRSA infection nor to guide or monitor treatment for MRSA infections.     Medications:  Scheduled:  . allopurinol  100 mg Oral Daily  . antiseptic oral rinse  7 mL Mouth Rinse BID  . aspirin EC  81 mg Oral Daily  . atorvastatin  80 mg Oral Daily  . enoxaparin (LOVENOX) injection  40 mg Subcutaneous Q12H  . loratadine  10 mg Oral Daily  . piperacillin-tazobactam (ZOSYN)  IV  4.5 g Intravenous 3 times per day  . tamsulosin  0.4 mg Oral Daily  . vancomycin  1,500 mg Intravenous Q12H   Infusions:  . sodium chloride Stopped (07/26/15 2235)  . dextrose 5 % and 0.45% NaCl 50 mL/hr at 07/26/15 2235  . insulin (NOVOLIN-R) infusion 9.1 Units/hr (07/26/15 2234)    Assessment: Pharmacy consulted to manage electrolytes for 60 yo male intensive care patient.     Plan:  Will order potassium PO x 1. Will obtain electrolytes with am labs.    Pharmacy will continue to monitor and adjust per consult.    Simpson,Michael L 07/26/2015,11:21 PM

## 2015-07-26 NOTE — Progress Notes (Signed)
   07/26/15 1300  Clinical Encounter Type  Visited With Patient and family together  Visit Type Initial;Spiritual support  Referral From Nurse  Consult/Referral To Chaplain  Spiritual Encounters  Spiritual Needs Emotional  Stress Factors  Patient Stress Factors Not reviewed  Family Stress Factors None identified  Chaplain rounded in the unit and was referred by nurse to visit with patient and family. Offered a compassionate presence and support. No need was expressed at this time. Chaplain Classie Weng A. Dequavious Harshberger Ext. [phone]

## 2015-07-26 NOTE — Consult Note (Signed)
Reason for Consult: Shoulder and ankle pain  Referring Physician: Dr. hospitalist  Edward HatchEdward Keith Myers   HPI: History of diabetes. About 3 years. 3 years ago he developed gout in the right first toe. Treated with injection. Near he took uloric for hyper uricemia. Stop because of cost. Then started allopurinol 100 daily. No definite flare since. Recent had respiratory infection., Alka-Seltzer. Low-grade fever. Cough. Felt swelling in the left midfoot. The ankle. Not the toe. Other joints well. Got up to go to the bathroom. Fell onto his right arm. Held edema. Since then has had pain around the area of the collarbone. Wants to hold his arm and abduction and rotation onto his body. Hurts to move it. Has not been in a sling. He's not had any swelling of the right arm. Other joints are not swelling. Is somewhat better. Has taken colchicine and steroid. Sugars been elevated. White count 12,000. Aim to emergency room because of an shoulder and foot. Glucose 325. White count 12,000. Uric acid 9.8. Shoulder film shows degenerative changes. Prominent before meals degenerative changes. Prior fracture.  PMH: Diabetes. Hypertension.  SURGICAL HISTORY: No joint surgeries.  Family History: Negative for gout.  Social History: Significant cigarettes or alcohol.  Allergies: No Known Allergies  Medications:  Scheduled: . allopurinol  100 mg Oral Daily  . antiseptic oral rinse  7 mL Mouth Rinse BID  . aspirin EC  81 mg Oral Daily  . atorvastatin  80 mg Oral Daily  . enoxaparin (LOVENOX) injection  40 mg Subcutaneous Q12H  . insulin aspart  0-9 Units Subcutaneous 6 times per day  . insulin regular  0-10 Units Intravenous TID WC  . loratadine  10 mg Oral Daily  . piperacillin-tazobactam (ZOSYN)  IV  4.5 g Intravenous 3 times per day  . tamsulosin  0.4 mg Oral Daily        ROS: Some shortness of breath. No recent chest pain or abdominal pain. No nausea or vomiting.   PHYSICAL EXAM: Blood pressure  110/74, pulse 86, temperature 98.4 F (36.9 C), temperature source Oral, resp. rate 21, height 6\' 1"  (1.854 m), weight 141.664 kg (312 lb 5 oz), SpO2 94 %. Good range of motion cervical spine. Left shoulder moves well. Elbows without nodules. Hands without synovitis. Right shoulder has significant pain at the clavicle. Bony. Pain along the clavicle. Mild pain with external rotation. No palpable effusion. This move well. No knee effusions. Midfoot is tender mildly swollen. MTP and ankle without significant synovitis  Assessment: Shoulder pain after fall. Concern about clavicle fracture Left midfoot tenosynovitis consistent with gout History of gouty arthritis. On low-dose allopurinol not adequately controlling his uric acid Diabetes  Recommendations: Agree with further imaging of right shoulder to rule out fracture Agree with current treatment of gouty tenosynovitis. After gout flare is resolved she will need increase his allopurinol to 200 with uric acid and creatinine in one month. Goal would be to get his uric acid below 6 No indication for injection of the ankle.  Edward Myers, Edward Myers 07/26/2015, 1:46 PM

## 2015-07-27 LAB — GLUCOSE, CAPILLARY
GLUCOSE-CAPILLARY: 125 mg/dL — AB (ref 65–99)
GLUCOSE-CAPILLARY: 134 mg/dL — AB (ref 65–99)
GLUCOSE-CAPILLARY: 140 mg/dL — AB (ref 65–99)
GLUCOSE-CAPILLARY: 140 mg/dL — AB (ref 65–99)
GLUCOSE-CAPILLARY: 140 mg/dL — AB (ref 65–99)
GLUCOSE-CAPILLARY: 141 mg/dL — AB (ref 65–99)
GLUCOSE-CAPILLARY: 217 mg/dL — AB (ref 65–99)
GLUCOSE-CAPILLARY: 240 mg/dL — AB (ref 65–99)
Glucose-Capillary: 173 mg/dL — ABNORMAL HIGH (ref 65–99)
Glucose-Capillary: 265 mg/dL — ABNORMAL HIGH (ref 65–99)
Glucose-Capillary: 323 mg/dL — ABNORMAL HIGH (ref 65–99)

## 2015-07-27 LAB — BASIC METABOLIC PANEL
ANION GAP: 8 (ref 5–15)
BUN: 25 mg/dL — ABNORMAL HIGH (ref 6–20)
CHLORIDE: 100 mmol/L — AB (ref 101–111)
CO2: 25 mmol/L (ref 22–32)
Calcium: 7.8 mg/dL — ABNORMAL LOW (ref 8.9–10.3)
Creatinine, Ser: 0.75 mg/dL (ref 0.61–1.24)
GFR calc Af Amer: >60 mL/min (ref >60)
GFR calc non Af Amer: >60 mL/min (ref >60)
GLUCOSE: 150 mg/dL — AB (ref 65–99)
POTASSIUM: 3.5 mmol/L (ref 3.5–5.1)
Sodium: 133 mmol/L — ABNORMAL LOW (ref 135–145)

## 2015-07-27 MED ORDER — DEXTROSE 10 % IV SOLN: INTRAVENOUS | Status: DC | PRN

## 2015-07-27 MED ORDER — METHYLPREDNISOLONE SODIUM SUCC 125 MG IJ SOLR
60.0000 mg | Freq: Once | INTRAMUSCULAR | Status: AC
  Administered 2015-07-27: 60 mg via INTRAVENOUS
  Filled 2015-07-27: qty 2

## 2015-07-27 MED ORDER — POTASSIUM CHLORIDE CRYS ER 20 MEQ PO TBCR
40.0000 meq | EXTENDED_RELEASE_TABLET | Freq: Once | ORAL | Status: AC
  Administered 2015-07-27: 40 meq via ORAL
  Filled 2015-07-27: qty 2

## 2015-07-27 MED ORDER — INSULIN ASPART 100 UNIT/ML ~~LOC~~ SOLN
0.0000 [IU] | Freq: Every day | SUBCUTANEOUS | Status: DC
  Administered 2015-07-27: 4 [IU] via SUBCUTANEOUS
  Administered 2015-07-28: 5 [IU] via SUBCUTANEOUS
  Administered 2015-07-29: 2 [IU] via SUBCUTANEOUS
  Administered 2015-07-30: 4 [IU] via SUBCUTANEOUS
  Filled 2015-07-27: qty 4
  Filled 2015-07-27: qty 5
  Filled 2015-07-27: qty 10
  Filled 2015-07-27: qty 5
  Filled 2015-07-27: qty 2
  Filled 2015-07-27: qty 4

## 2015-07-27 MED ORDER — OXYCODONE HCL 5 MG PO TABS
10.0000 mg | ORAL_TABLET | ORAL | Status: DC | PRN
  Administered 2015-07-28 – 2015-07-31 (×9): 10 mg via ORAL
  Filled 2015-07-27 (×10): qty 2

## 2015-07-27 MED ORDER — COLCHICINE 0.6 MG PO TABS
0.6000 mg | ORAL_TABLET | Freq: Every day | ORAL | Status: DC
  Administered 2015-07-27 – 2015-07-28 (×2): 0.6 mg via ORAL
  Filled 2015-07-27 (×2): qty 1

## 2015-07-27 MED ORDER — INSULIN ASPART 100 UNIT/ML ~~LOC~~ SOLN
0.0000 [IU] | Freq: Three times a day (TID) | SUBCUTANEOUS | Status: DC
  Administered 2015-07-27: 8 [IU] via SUBCUTANEOUS
  Administered 2015-07-27: 5 [IU] via SUBCUTANEOUS
  Administered 2015-07-27: 2 [IU] via SUBCUTANEOUS
  Administered 2015-07-28: 15 [IU] via SUBCUTANEOUS
  Administered 2015-07-28 (×2): 11 [IU] via SUBCUTANEOUS
  Administered 2015-07-29 (×2): 8 [IU] via SUBCUTANEOUS
  Administered 2015-07-29: 15 [IU] via SUBCUTANEOUS
  Administered 2015-07-30 (×2): 3 [IU] via SUBCUTANEOUS
  Administered 2015-07-30: 8 [IU] via SUBCUTANEOUS
  Administered 2015-07-31: 2 [IU] via SUBCUTANEOUS
  Administered 2015-07-31: 5 [IU] via SUBCUTANEOUS
  Filled 2015-07-27: qty 5
  Filled 2015-07-27 (×4): qty 8
  Filled 2015-07-27: qty 3
  Filled 2015-07-27: qty 11
  Filled 2015-07-27: qty 3
  Filled 2015-07-27: qty 15
  Filled 2015-07-27: qty 5
  Filled 2015-07-27 (×2): qty 2
  Filled 2015-07-27: qty 11
  Filled 2015-07-27: qty 15

## 2015-07-27 MED ORDER — INSULIN ASPART 100 UNIT/ML ~~LOC~~ SOLN
5.0000 [IU] | Freq: Three times a day (TID) | SUBCUTANEOUS | Status: DC
  Administered 2015-07-27 – 2015-07-28 (×4): 5 [IU] via SUBCUTANEOUS
  Filled 2015-07-27 (×3): qty 5

## 2015-07-27 MED ORDER — INSULIN GLARGINE 100 UNIT/ML ~~LOC~~ SOLN
30.0000 [IU] | SUBCUTANEOUS | Status: DC
  Administered 2015-07-27 – 2015-07-28 (×2): 30 [IU] via SUBCUTANEOUS
  Filled 2015-07-27 (×3): qty 0.3

## 2015-07-27 NOTE — Progress Notes (Signed)
Initial Nutrition Assessment    INTERVENTION:   Meals and Snacks: Cater to patient preferences   NUTRITION DIAGNOSIS:   No nutrition diagnosis at this time  GOAL:   Patient will meet greater than or equal to 90% of their needs  MONITOR:    (Energy Intake, Anthropometrics, Digestive System, Electrolyte/Renal Profile, GLucose Profile)  REASON FOR ASSESSMENT:   Malnutrition Screening Tool    ASSESSMENT:    Pt admitted severe pain and swelling in shoulder s/p fall, hypotension with sepsis. Pt arousable but very sleepy on visit today  Past Medical History  Diagnosis Date  . Chronic gouty arthropathy   . Glaucoma   . Hyperlipidemia   . Hypertension   . Rhinitis medicamentosa   . Diabetes mellitus without complication (HCC)   . Morbid obesity (HCC)   . UTI (urinary tract infection)    Diet Order:  Diet heart healthy/carb modified Room service appropriate?: Yes; Fluid consistency:: Thin   Energy Intake: pt ate breakfast this AM, recorded po intake 85% at dinner last night. Appetite fairly good at present  Food and Nutrition Related History: pt reports poor appetite for few days prior to admision but before this pt eating well  Electrolyte and Renal Profile:  Recent Labs Lab 07/25/15 1248 07/26/15 0637 07/27/15 0656  BUN 40* 35* 25*  CREATININE 1.09 1.06 0.75  NA 124* 131* 133*  K 3.7 3.1* 3.5  MG  --  2.6*  --   PHOS  --  2.8  --    Glucose Profile:  Recent Labs  07/27/15 0555 07/27/15 0717 07/27/15 1121  GLUCAP 125* 141* 240*   Meds: ss novolog, lantus, solumedrol  Nutrition Focused physical examm: Nutrition-Focused physical exam completed. Findings are no fat depletion, no muscle depletion, and mild edema.     Height:   Ht Readings from Last 1 Encounters:  07/25/15 6\' 1"  (1.854 m)    Weight: pt reports he has lost 20-30 pounds recently but reports that this was intentional wt loss; pt reports he is always trying to lose weight. Per weight  encounters, 6% wt loss since April of last year (9 months)  Wt Readings from Last 1 Encounters:  07/25/15 312 lb 5 oz (141.664 kg)    Wt Readings from Last 10 Encounters:  07/25/15 312 lb 5 oz (141.664 kg)  07/23/15 300 lb (136.079 kg)  10/31/14 332 lb (150.594 kg)    BMI:  Body mass index is 41.21 kg/(m^2).    LOW Care Level  Romelle StarcherCate Iris Hairston MS, IowaRD, LDN [phone] Pager  [phone] Weekend/On-Call Pager

## 2015-07-27 NOTE — Progress Notes (Signed)
Inpatient Diabetes Program Recommendations  AACE/ADA: New Consensus Statement on Inpatient Glycemic Control (2015)  Target Ranges:  Prepandial:   less than 140 mg/dL      Peak postprandial:   less than 180 mg/dL (1-2 hours)      Critically ill patients:  140 - 180 mg/dL   Review of Glycemic Control    Diabetes history:Type 2 Outpatient Diabetes medications: Novolog mix 75/25 25 units daily, Metformin 1000mg  bid Current orders for Inpatient glycemic control:Lantus 30 units qday, Novolog moderate correction scale tid with meals, Novolog 0-5 units qhs  Inpatient Diabetes Program Recommendations: Blood sugars elevated post glucostabilizer. Does not appear to have received correction insulin at the time the glucostabilizer was stopped.  Ordered Lantus 30 units q24hour and correction insulin tid and hs but consider adding Novolog 5 units tid with meals- continue Novolog correction as ordered.   Susette RacerJulie Joshuan Bolander, RN, BA, MHA, CDE Diabetes Coordinator Inpatient Diabetes Program  [phone] (Team Pager) [phone] [facility] Office) 07/27/2015 11:38 AM

## 2015-07-27 NOTE — Progress Notes (Signed)
Pt being moved to room 147.  [facility]  Pt is alert and oriented.  Wife remains at bedside.

## 2015-07-27 NOTE — Consult Note (Signed)
ORTHOPAEDIC CONSULTATION  REQUESTING PHYSICIAN: Alford Highland, MD  Chief Complaint: Right shoulder pain  HPI: Edward Myers is a 59 y.o. male who complains of  right shoulder pain following a fall several days ago.  He caught himself while going down and hyper abducted the right shoulder.  He developed pain gradually after that.  He has been hospitalized for other problems last couple days.  The shoulder pain has been unrelenting, however.  He does have a history of gout.  Past Medical History  Diagnosis Date  . Chronic gouty arthropathy   . Glaucoma   . Hyperlipidemia   . Hypertension   . Rhinitis medicamentosa   . Diabetes mellitus without complication (HCC)   . Morbid obesity (HCC)   . UTI (urinary tract infection)    Past Surgical History  Procedure Laterality Date  . Tonsillectomy    . Circumcision     Social History   Social History  . Marital Status: Married    Spouse Name: N/A  . Number of Children: N/A  . Years of Education: N/A   Social History Main Topics  . Smoking status: Former Smoker    Types: Cigars    Quit date: 01/25/2010  . Smokeless tobacco: None  . Alcohol Use: Yes  . Drug Use: None  . Sexual Activity: Not Asked   Other Topics Concern  . None   Social History Narrative   Family History  Problem Relation Age of Onset  . Heart attack Mother   . Diabetes Mother    No Known Allergies Prior to Admission medications   Medication Sig Start Date End Date Taking? Authorizing Provider  allopurinol (ZYLOPRIM) 100 MG tablet Take 100 mg by mouth daily.    Yes Historical Provider, MD  amLODipine (NORVASC) 10 MG tablet Take 1 tablet (10 mg total) by mouth daily. 01/29/15  Yes Steele Sizer, MD  aspirin EC 81 MG tablet Take 81 mg by mouth daily.   Yes Historical Provider, MD  atorvastatin (LIPITOR) 80 MG tablet Take 80 mg by mouth daily.   Yes Historical Provider, MD  fexofenadine-pseudoephedrine (ALLEGRA-D 24) 180-240 MG 24 hr tablet Take 1  tablet by mouth daily as needed (for allergies).   Yes Historical Provider, MD  fluticasone (FLONASE) 50 MCG/ACT nasal spray Place 2 sprays into both nostrils daily as needed for rhinitis.    Yes Historical Provider, MD  hydrochlorothiazide (HYDRODIURIL) 25 MG tablet Take 25 mg by mouth daily.   Yes Historical Provider, MD  insulin aspart protamine- aspart (NOVOLOG MIX 70/30) (70-30) 100 UNIT/ML injection Inject 25 Units into the skin daily.   Yes Historical Provider, MD  losartan (COZAAR) 100 MG tablet Take 1 tablet (100 mg total) by mouth daily. 01/31/15  Yes Steele Sizer, MD  metFORMIN (GLUCOPHAGE) 500 MG tablet Take 1,000 mg by mouth 2 (two) times daily with a meal.   Yes Historical Provider, MD  oxyCODONE-acetaminophen (PERCOCET) 5-325 MG tablet Take 1 tablet by mouth every 4 (four) hours as needed for severe pain. 07/23/15  Yes Tommi Rumps, PA-C  sulfamethoxazole-trimethoprim (BACTRIM DS,SEPTRA DS) 800-160 MG tablet Take 1 tablet by mouth 2 (two) times daily. 07/23/15  Yes Tommi Rumps, PA-C  tamsulosin (FLOMAX) 0.4 MG CAPS capsule Take 1 capsule (0.4 mg total) by mouth daily. 07/18/15  Yes Steele Sizer, MD   Ct Shoulder Right Wo Contrast  07/26/2015  CLINICAL DATA:  Right shoulder pain.  Fall this past weekend. EXAM: CT OF THE RIGHT  SHOULDER WITHOUT CONTRAST TECHNIQUE: Multidetector CT imaging was performed according to the standard protocol. Multiplanar CT image reconstructions were also generated. COMPARISON:  07/23/2015 radiographs FINDINGS: No regional fracture or dislocation. Moderate degenerative AC joint arthropathy with a small well corticated ossicle along the anterior margin of the AC joint, likely from a chronically fragmented spur from the distal anterior clavicle. Moderate distal clavicular spurring. Ossific structures within or along the distal subscapularis tendon observed. Extensive glenohumeral spurring. Chronically fragmented inferior spur from the anterior inferior  glenoid. Subacromial morphology is type 2 (curved). No overt atrophy of the rotator cuff musculature. Today's CT exam is not accurate in assessing for rotator cuff tear or in assessing the labrum or biceps. I am suspicious that there is likely a small amount of fluid in the subacromial subdeltoid bursa. There is also mid some edema tracking around the [facility] joint. Also there is edema tracking along the distal trapezius muscle and adjacent subcutaneous tissues. IMPRESSION: 1. No fracture or dislocation identified. 2. 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 3. Suspected heterotopic calcification along the distal subscapularis tendon with multiple well corticated ossific structures in this vicinity. Chronically fragmented inferior spurring from the anterior inferior glenoid. 4. I suspect subacromial subdeltoid bursitis. 5. Edema along the superficial margin of the trapezius muscle -trapezius strain not excluded. Electronically Signed   By: Gaylyn RongWalter  Liebkemann M.D.   On: 07/26/2015 16:51   [place] Venous Img Lower Unilateral Left  07/26/2015  CLINICAL DATA:  Left lower extremity pain and edema. Evaluate for DVT. EXAM: LEFT LOWER EXTREMITY VENOUS DOPPLER ULTRASOUND TECHNIQUE: Gray-scale sonography with graded compression, as well as color Doppler and duplex ultrasound were performed to evaluate the lower extremity deep venous systems from the level of the common femoral vein and including the common femoral, femoral, profunda femoral, popliteal and calf veins including the posterior tibial, peroneal and gastrocnemius veins when visible. The superficial great saphenous vein was also interrogated. Spectral Doppler was utilized to evaluate flow at rest and with distal augmentation maneuvers in the common femoral, femoral and popliteal veins. COMPARISON:  None. FINDINGS: Contralateral Common Femoral Vein: Respiratory phasicity is normal and symmetric with the symptomatic  side. No evidence of thrombus. Normal compressibility. Common Femoral Vein: No evidence of thrombus. Normal compressibility, respiratory phasicity and response to augmentation. Saphenofemoral Junction: No evidence of thrombus. Normal compressibility and flow on color Doppler imaging. Profunda Femoral Vein: No evidence of thrombus. Normal compressibility and flow on color Doppler imaging. Femoral Vein: No evidence of thrombus. Normal compressibility, respiratory phasicity and response to augmentation. Popliteal Vein: No evidence of thrombus. Normal compressibility, respiratory phasicity and response to augmentation. Calf Veins: No evidence of thrombus. Normal compressibility and flow on color Doppler imaging. Superficial Great Saphenous Vein: No evidence of thrombus. Normal compressibility and flow on color Doppler imaging. Venous Reflux:  None. Other Findings:  None. IMPRESSION: No evidence of DVT within the left lower extremity. Electronically Signed   By: Simonne ComeJohn  Watts M.D.   On: 07/26/2015 10:23    Positive ROS: All other systems have been reviewed and were otherwise negative with the exception of those mentioned in the HPI and as above.  Physical Exam: General: Alert, no acute distress Cardiovascular: No pedal edema Respiratory: No cyanosis, no use of accessory musculature GI: No organomegaly, abdomen is soft and non-tender Skin: No lesions in the area of chief complaint Neurologic: Sensation intact distally Psychiatric: Patient is competent for consent with normal mood and affect Lymphatic: No axillary or cervical lymphadenopathy  MUSCULOSKELETAL: Patient is point tender over the acromioclavicular joint of the right shoulder.  This correlates with extensive spurring at this level.  He has fairly good passive motion of the shoulder joint itself and good external rotation.  Neurovascular status is normal.  No redness, heat, or sign of inflammation infection and the patient before meals region.   Remainder the clavicles are unremarkable.  Head and neck motion is satisfactory without much pain.  Assessment: Acute inflammation, right acromioclavicular joint secondary to trauma and degenerative change  Plan: I injected the right acromioclavicular joint sterilely with Xylocaine, Marcaine, Kenalog and Celestone.  He will ice this and use a sling as needed.  Continue IV cortisone as needed.  Pain medicine as needed.  May follow up in my office and after hospitalization as needed.    Valinda Hoar, MD [phone]   07/27/2015 3:05 PM

## 2015-07-27 NOTE — Progress Notes (Signed)
Patient ID: Edward Myers, male   DOB: 1955-12-29, 60 y.o.   MRN: [id number] [facility] PRO[facility]  Edward Myers WUJ:[id number] DOB: 1955-12-29 DOA: 07/25/2015 PCP: Vonita MossMark Crissman, MD  HPI/Subjective: Patient seen a little bit earlier. Still with severe pain in the right shoulder and unable to move it. No relief from his pain. The morphine helps a little bit where he can sleep but when he wakes up still has pain. His left ankle still painful and now it seemed to move to the top of his foot.  Objective: Filed Vitals:   07/27/15 1200 07/27/15 1300  BP: 130/85 104/92  Pulse: 83 78  Temp:  97.8 F (36.6 C)  Resp: 22 26    Filed Weights   07/25/15 1239 07/25/15 1300  Weight: 133.811 kg (295 lb) 141.664 kg (312 lb 5 oz)    ROS: Review of Systems  Constitutional: Negative for fever and chills.  Eyes: Negative for blurred vision.  Respiratory: Negative for cough and shortness of breath.   Cardiovascular: Negative for chest pain.  Gastrointestinal: Negative for nausea, vomiting, abdominal pain, diarrhea and constipation.  Genitourinary: Negative for dysuria.  Musculoskeletal: Positive for joint pain.  Neurological: Negative for dizziness and headaches.   Exam: Physical Exam  Constitutional: He is oriented to person, place, and time.  HENT:  Right Ear: Tympanic membrane normal.  Left Ear: Tympanic membrane normal.  Nose: No mucosal edema.  Mouth/Throat: No oropharyngeal exudate or posterior oropharyngeal edema.  Eyes: Conjunctivae, EOM and lids are normal. Pupils are equal, round, and reactive to light.  Neck: Neck supple. No JVD present. Carotid bruit is not present. No tracheal deviation present. No thyroid mass and no thyromegaly present.  Cardiovascular: Regular rhythm, S2 normal and normal heart sounds.   No murmur heard. Pulses:      Carotid pulses are 2+ on the right side, and 2+ on the left side.      Dorsalis pedis pulses are 2+ on the right  side, and 2+ on the left side.  Respiratory: No accessory muscle usage. No respiratory distress. He has no wheezes. He has no rhonchi. He has no rales.  GI: Soft. Bowel sounds are normal. He exhibits no distension. There is no hepatosplenomegaly. There is no CVA tenderness.  Musculoskeletal:       Right shoulder: He exhibits decreased range of motion, tenderness, swelling and pain.       Left ankle: He exhibits decreased range of motion and swelling.  Pain the top of the left foot to palpation.  Lymphadenopathy:    He has no cervical adenopathy.    He has no axillary adenopathy.  Neurological: He is alert and oriented to person, place, and time. He has normal strength. No cranial nerve deficit or sensory deficit.  Reflex Scores:      Patellar reflexes are 2+ on the right side and 2+ on the left side. Skin: Skin is warm. Nails show no clubbing.  Chronic lower extremity discoloration.  Psychiatric: He has a normal mood and affect.    Data Reviewed: Basic Metabolic Panel:  Recent Labs Lab 07/23/15 1428 07/25/15 1248 07/26/15 0637 07/27/15 0656  NA 127* 124* 131* 133*  K 3.8 3.7 3.1* 3.5  CL 93* 90* 101 100*  CO2 20* 20* 27 25  GLUCOSE 436* 452* 326* 150*  BUN 28* 40* 35* 25*  CREATININE 1.03 1.09 1.06 0.75  CALCIUM 8.8* 8.1* 7.6* 7.8*  MG  --   --  2.6*  --  PHOS  --   --  2.8  --    Liver Function Tests:  Recent Labs Lab 07/23/15 1428 07/25/15 1248  AST 20 13*  ALT 36 26  ALKPHOS 86 97  BILITOT 2.1* 1.3*  PROT 6.9 6.4*  ALBUMIN 3.5 2.9*    Recent Labs Lab 07/25/15 1248  LIPASE 23   CBC:  Recent Labs Lab 07/23/15 1428 07/25/15 1248 07/26/15 0637  WBC 13.3* 17.1* 12.3*  NEUTROABS 11.6* 14.2*  --   HGB 14.7 13.6 12.3*  HCT 44.2 41.5 37.3*  MCV 80.8 81.6 80.6  PLT 134* 149* 129*   Cardiac Enzymes:  Recent Labs Lab 07/25/15 1248 07/26/15 0637  TROPONINI 0.12* 0.08*    CBG:  Recent Labs Lab 07/27/15 0355 07/27/15 0458 07/27/15 0555  07/27/15 0717 07/27/15 1121  GLUCAP 140* 140* 125* 141* 240*    Recent Results (from the past 240 hour(s))  Blood Culture (routine x 2)     Status: None (Preliminary result)   Collection Time: 07/25/15 12:48 PM  Result Value Ref Range Status   Specimen Description BLOOD LEFT HAND  Final   Special Requests   Final    BOTTLES DRAWN AEROBIC AND ANAEROBIC AEROBIC ANAEROBIC   Culture NO GROWTH 2 DAYS  Final   Report Status PENDING  Incomplete  Urine culture     Status: None   Collection Time: 07/25/15 12:48 PM  Result Value Ref Range Status   Specimen Description URINE, RANDOM  Final   Special Requests NONE  Final   Culture NO GROWTH  Final   Report Status 07/26/2015 FINAL  Final  Blood Culture (routine x 2)     Status: None (Preliminary result)   Collection Time: 07/25/15  1:05 PM  Result Value Ref Range Status   Specimen Description BLOOD RIGHT FATTY CASTS  Final   Special Requests   Final    BOTTLES DRAWN AEROBIC AND ANAEROBIC ANA 5CC AERO 10CC   Culture NO GROWTH 2 DAYS  Final   Report Status PENDING  Incomplete  MRSA PCR Screening     Status: None   Collection Time: 07/25/15  8:45 PM  Result Value Ref Range Status   MRSA by PCR NEGATIVE NEGATIVE Final    Comment:        The GeneXpert MRSA Assay (FDA approved for NASAL specimens only), is one component of a comprehensive MRSA colonization surveillance program. It is not intended to diagnose MRSA infection nor to guide or monitor treatment for MRSA infections.      Studies: Ct Shoulder Right Wo Contrast  07/26/2015  CLINICAL DATA:  Right shoulder pain.  Fall this past weekend. EXAM: CT OF THE RIGHT SHOULDER WITHOUT CONTRAST TECHNIQUE: Multidetector CT imaging was performed according to the standard protocol. Multiplanar CT image reconstructions were also generated. COMPARISON:  07/23/2015 radiographs FINDINGS: No regional fracture or dislocation. Moderate degenerative AC joint arthropathy with a small well  corticated ossicle along the anterior margin of the AC joint, likely from a chronically fragmented spur from the distal anterior clavicle. Moderate distal clavicular spurring. Ossific structures within or along the distal subscapularis tendon observed. Extensive glenohumeral spurring. Chronically fragmented inferior spur from the anterior inferior glenoid. Subacromial morphology is type 2 (curved). No overt atrophy of the rotator cuff musculature. Today's CT exam is not accurate in assessing for rotator cuff tear or in assessing the labrum or biceps. I am suspicious that there is likely a small amount of fluid in the subacromial subdeltoid bursa.  There is also mid some edema tracking around the [facility] joint. Also there is edema tracking along the distal trapezius muscle and adjacent subcutaneous tissues. IMPRESSION: 1. No fracture or dislocation identified. 2. 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 3. Suspected heterotopic calcification along the distal subscapularis tendon with multiple well corticated ossific structures in this vicinity. Chronically fragmented inferior spurring from the anterior inferior glenoid. 4. I suspect subacromial subdeltoid bursitis. 5. Edema along the superficial margin of the trapezius muscle -trapezius strain not excluded. Electronically Signed   By: Gaylyn Rong M.D.   On: 07/26/2015 16:51   [place] Venous Img Lower Unilateral Left  07/26/2015  CLINICAL DATA:  Left lower extremity pain and edema. Evaluate for DVT. EXAM: LEFT LOWER EXTREMITY VENOUS DOPPLER ULTRASOUND TECHNIQUE: Gray-scale sonography with graded compression, as well as color Doppler and duplex ultrasound were performed to evaluate the lower extremity deep venous systems from the level of the common femoral vein and including the common femoral, femoral, profunda femoral, popliteal and calf veins including the posterior tibial, peroneal and gastrocnemius veins when  visible. The superficial great saphenous vein was also interrogated. Spectral Doppler was utilized to evaluate flow at rest and with distal augmentation maneuvers in the common femoral, femoral and popliteal veins. COMPARISON:  None. FINDINGS: Contralateral Common Femoral Vein: Respiratory phasicity is normal and symmetric with the symptomatic side. No evidence of thrombus. Normal compressibility. Common Femoral Vein: No evidence of thrombus. Normal compressibility, respiratory phasicity and response to augmentation. Saphenofemoral Junction: No evidence of thrombus. Normal compressibility and flow on color Doppler imaging. Profunda Femoral Vein: No evidence of thrombus. Normal compressibility and flow on color Doppler imaging. Femoral Vein: No evidence of thrombus. Normal compressibility, respiratory phasicity and response to augmentation. Popliteal Vein: No evidence of thrombus. Normal compressibility, respiratory phasicity and response to augmentation. Calf Veins: No evidence of thrombus. Normal compressibility and flow on color Doppler imaging. Superficial Great Saphenous Vein: No evidence of thrombus. Normal compressibility and flow on color Doppler imaging. Venous Reflux:  None. Other Findings:  None. IMPRESSION: No evidence of DVT within the left lower extremity. Electronically Signed   By: Simonne Come M.D.   On: 07/26/2015 10:23    Scheduled Meds: . allopurinol  100 mg Oral Daily  . aspirin EC  81 mg Oral Daily  . atorvastatin  80 mg Oral Daily  . colchicine  0.6 mg Oral Daily  . enoxaparin (LOVENOX) injection  40 mg Subcutaneous Q12H  . insulin aspart  0-15 Units Subcutaneous TID WC  . insulin aspart  0-5 Units Subcutaneous QHS  . insulin aspart  5 Units Subcutaneous TID WC  . insulin glargine  30 Units Subcutaneous Q24H  . loratadine  10 mg Oral Daily  . methylPREDNISolone (SOLU-MEDROL) injection  60 mg Intravenous Once  . tamsulosin  0.4 mg Oral Daily    Assessment/Plan:  1. Shock,  hypotension, leukocytosis and tachycardia. Cultures are negative. I will DC antibiotics. Blood pressure improved with IV fluids. I will hold off on fluids at this time. Wondering if he was just not eating very well with the severe pain that he was having taking a lot of pain medications and his blood pressure medications lowering his blood pressure too much. 2. Right shoulder pain and poor range of motion. CT scan showing a lot of spurring around the before meals joints and other areas in the shoulder. Case discussed with Dr. Hyacinth Meeker orthopedic surgery who will inject the joint. Pain control with IV morphine  and oral oxycodone. 3. Left foot pain- this still could be gout tablet dose Solu-Medrol 1 and colchicine again. 4. Uncontrolled diabetes with hyperglycemia- Lantus and sliding scale 5. Hyponatremia- likely secondary to hydrochlorothiazide. Slowly improving 6. Elevated troponin likely demand ischemia from hypotension- continue aspirin. Stop heparin drip just in case arthrocentesis needed. Serial troponins 7. Hyperlipidemia unspecified on atorvastatin 8. BPH on Flomax  Code Status:     Code Status Orders        Start     Ordered   07/25/15 2028  Full code   Continuous     07/25/15 2027    Code Status History    Date Active Date Inactive Code Status Order ID Comments User Context   This patient has a current code status but no historical code status.    Advance Directive Documentation        Most Recent Value   Type of Advance Directive  Living will   Pre-existing out of facility DNR order (yellow form or pink MOST form)     "MOST" Form in Place?       Family Communication: Family at bedside. Disposition Plan: To be determined  Consultants:  Rheumatology  Cardiology  Orthopedic surgery  Time spent: 30 minutes  Alford Highland  [facility]

## 2015-07-27 NOTE — Progress Notes (Signed)
PHARMACY - CRITICAL CARE PROGRESS NOTE  Pharmacy Consult for Electrolyte Management    No Known Allergies  Patient Measurements: Height: 6\' 1"  (185.4 cm) Weight: (!) 312 lb 5 oz (141.664 kg) IBW/kg (Calculated) : 79.9   Vital Signs: Temp: 97.8 F (36.6 C) (01/13 1300) Temp Source: Oral (01/13 1300) BP: 104/92 mmHg (01/13 1300) Pulse Rate: 78 (01/13 1300) Intake/Output from previous day: 01/12 0701 - 01/13 0700 In: 2764.9 [P.O.:240; I.V.:1524.9; IV Piggyback:1000] Out: 1350 [Urine:1350] Intake/Output from this shift: Total I/O In: -  Out: 475 [Urine:475] Vent settings for last 24 hours: Vent Mode:  [-]  FiO2 (%):  [28 %] 28 %  Labs:  Recent Labs  07/25/15 1248 07/26/15 0637 07/27/15 0656  WBC 17.1* 12.3*  --   HGB 13.6 12.3*  --   HCT 41.5 37.3*  --   PLT 149* 129*  --   APTT 28  --   --   INR 1.32  --   --   CREATININE 1.09 1.06 0.75  MG  --  2.6*  --   PHOS  --  2.8  --   ALBUMIN 2.9*  --   --   PROT 6.4*  --   --   AST 13*  --   --   ALT 26  --   --   ALKPHOS 97  --   --   BILITOT 1.3*  --   --    Estimated Creatinine Clearance: 150.7 mL/min (by C-G formula based on Cr of 0.75).   Recent Labs  07/27/15 0555 07/27/15 0717 07/27/15 1121  GLUCAP 125* 141* 240*    Microbiology: Recent Results (from the past 720 hour(s))  Blood Culture (routine x 2)     Status: None (Preliminary result)   Collection Time: 07/25/15 12:48 PM  Result Value Ref Range Status   Specimen Description BLOOD LEFT HAND  Final   Special Requests   Final    BOTTLES DRAWN AEROBIC AND ANAEROBIC AEROBIC ANAEROBIC   Culture NO GROWTH 2 DAYS  Final   Report Status PENDING  Incomplete  Urine culture     Status: None   Collection Time: 07/25/15 12:48 PM  Result Value Ref Range Status   Specimen Description URINE, RANDOM  Final   Special Requests NONE  Final   Culture NO GROWTH  Final   Report Status 07/26/2015 FINAL  Final  Blood Culture (routine x 2)     Status:  None (Preliminary result)   Collection Time: 07/25/15  1:05 PM  Result Value Ref Range Status   Specimen Description BLOOD RIGHT FATTY CASTS  Final   Special Requests   Final    BOTTLES DRAWN AEROBIC AND ANAEROBIC ANA 5CC AERO 10CC   Culture NO GROWTH 2 DAYS  Final   Report Status PENDING  Incomplete  MRSA PCR Screening     Status: None   Collection Time: 07/25/15  8:45 PM  Result Value Ref Range Status   MRSA by PCR NEGATIVE NEGATIVE Final    Comment:        The GeneXpert MRSA Assay (FDA approved for NASAL specimens only), is one component of a comprehensive MRSA colonization surveillance program. It is not intended to diagnose MRSA infection nor to guide or monitor treatment for MRSA infections.     Medications:  Scheduled:  . allopurinol  100 mg Oral Daily  . aspirin EC  81 mg Oral Daily  . atorvastatin  80 mg Oral Daily  .  colchicine  0.6 mg Oral Daily  . enoxaparin (LOVENOX) injection  40 mg Subcutaneous Q12H  . insulin aspart  0-15 Units Subcutaneous TID WC  . insulin aspart  0-5 Units Subcutaneous QHS  . insulin aspart  5 Units Subcutaneous TID WC  . insulin glargine  30 Units Subcutaneous Q24H  . loratadine  10 mg Oral Daily  . methylPREDNISolone (SOLU-MEDROL) injection  60 mg Intravenous Once  . tamsulosin  0.4 mg Oral Daily   Infusions:     Assessment: Pharmacy consulted to manage electrolytes for [age] yo male intensive care patient.     Plan:  Electrolytes WNL. Will obtain electrolytes with am labs, if WNL, will transition to Q48hr monitoring.    Pharmacy will continue to monitor and adjust per consult.    Tran Arzuaga L 07/27/2015,3:44 PM

## 2015-07-27 NOTE — Progress Notes (Signed)
[facility]  SUBJECTIVE: more alert today. No chest pain   Filed Vitals:   07/27/15 1000 07/27/15 1100 07/27/15 1200 07/27/15 1300  BP: 106/73 113/77 130/85 104/92  Pulse: 78 77 83 78  Temp:    97.8 F (36.6 C)  TempSrc:    Oral  Resp: 20 13 22 26   Height:      Weight:      SpO2: 92% 96% 87% 95%    Intake/Output Summary (Last 24 hours) at 07/27/15 1711 Last data filed at 07/27/15 1340  Gross per 24 hour  Intake 1240.45 ml  Output   1175 ml  Net  65.45 ml    LABS: Basic Metabolic Panel:  Recent Labs  16/04/9600/12/17 0637 07/27/15 0656  NA 131* 133*  K 3.1* 3.5  CL 101 100*  CO2 27 25  GLUCOSE 326* 150*  BUN 35* 25*  CREATININE 1.06 0.75  CALCIUM 7.6* 7.8*  MG 2.6*  --   PHOS 2.8  --    Liver Function Tests:  Recent Labs  07/25/15 1248  AST 13*  ALT 26  ALKPHOS 97  BILITOT 1.3*  PROT 6.4*  ALBUMIN 2.9*    Recent Labs  07/25/15 1248  LIPASE 23   CBC:  Recent Labs  07/25/15 1248 07/26/15 0637  WBC 17.1* 12.3*  NEUTROABS 14.2*  --   HGB 13.6 12.3*  HCT 41.5 37.3*  MCV 81.6 80.6  PLT 149* 129*   Cardiac Enzymes:  Recent Labs  07/25/15 1248 07/26/15 0637  TROPONINI 0.12* 0.08*   BNP: Invalid input(s): POCBNP D-Dimer: No results for input(s): DDIMER in the last 72 hours. Hemoglobin A1C: No results for input(s): HGBA1C in the last 72 hours. Fasting Lipid Panel: No results for input(s): CHOL, HDL, LDLCALC, TRIG, CHOLHDL, LDLDIRECT in the last 72 hours. Thyroid Function Tests: No results for input(s): TSH, T4TOTAL, T3FREE, THYROIDAB in the last 72 hours.  Invalid input(s): FREET3 Anemia Panel: No results for input(s): VITAMINB12, FOLATE, FERRITIN, TIBC, IRON, RETICCTPCT in the last 72 hours.   Physical Exam: Blood pressure 104/92, pulse 78, temperature 97.8 F (36.6 C), temperature source Oral, resp. rate 26, height 6\' 1"  (1.854 m), weight 141.664 kg (312 lb 5 oz), SpO2 95 %.    General  appearance: alert and cooperative Resp: clear to auscultation bilaterally Cardio: regular rate and rhythm GI: soft, non-tender; bowel sounds normal; no masses,  no organomegaly Neurologic: Grossly normal  TELEMETRY: Reviewed telemetry pt in nsr:  ASSESSMENT AND PLAN:  Active Problems:   Shock (HCC)-improved somewhat. Pressure improved. Will continue abx   Demand ischemia (HCC)-no evidence of ischemia. Troponin trending down.     Dalia HeadingFATH,Edward Ruan A., MD, [facility]ACC 07/27/2015 5:11 PM

## 2015-07-28 LAB — BASIC METABOLIC PANEL
ANION GAP: 8 (ref 5–15)
BUN: 27 mg/dL — ABNORMAL HIGH (ref 6–20)
CHLORIDE: 100 mmol/L — AB (ref 101–111)
CO2: 24 mmol/L (ref 22–32)
Calcium: 8.3 mg/dL — ABNORMAL LOW (ref 8.9–10.3)
Creatinine, Ser: 0.62 mg/dL (ref 0.61–1.24)
GFR calc Af Amer: >60 mL/min (ref >60)
GLUCOSE: 318 mg/dL — AB (ref 65–99)
POTASSIUM: 4.5 mmol/L (ref 3.5–5.1)
Sodium: 132 mmol/L — ABNORMAL LOW (ref 135–145)

## 2015-07-28 LAB — CBC
HEMATOCRIT: 39.2 % — AB (ref 40.0–52.0)
HEMOGLOBIN: 12.6 g/dL — AB (ref 13.0–18.0)
MCH: 26.2 pg (ref 26.0–34.0)
MCHC: 32.3 g/dL (ref 32.0–36.0)
MCV: 81.1 fL (ref 80.0–100.0)
Platelets: 180 10*3/uL (ref 150–440)
RBC: 4.83 MIL/uL (ref 4.40–5.90)
RDW: 13.9 % (ref 11.5–14.5)
WBC: 8.5 10*3/uL (ref 3.8–10.6)

## 2015-07-28 LAB — GLUCOSE, CAPILLARY
Glucose-Capillary: 334 mg/dL — ABNORMAL HIGH (ref 65–99)
Glucose-Capillary: 347 mg/dL — ABNORMAL HIGH (ref 65–99)
Glucose-Capillary: 362 mg/dL — ABNORMAL HIGH (ref 65–99)
Glucose-Capillary: 315 mg/dL — ABNORMAL HIGH (ref 65–99)
Glucose-Capillary: 392 mg/dL — ABNORMAL HIGH (ref 65–99)

## 2015-07-28 MED ORDER — COLCHICINE 0.6 MG PO TABS
0.6000 mg | ORAL_TABLET | ORAL | Status: AC
  Administered 2015-07-28 (×2): 0.6 mg via ORAL
  Filled 2015-07-28 (×3): qty 1

## 2015-07-28 MED ORDER — INSULIN GLARGINE 100 UNIT/ML ~~LOC~~ SOLN
40.0000 [IU] | SUBCUTANEOUS | Status: DC
  Administered 2015-07-29 – 2015-07-30 (×2): 40 [IU] via SUBCUTANEOUS
  Filled 2015-07-28 (×2): qty 0.4

## 2015-07-28 MED ORDER — CEPHALEXIN 500 MG PO CAPS
500.0000 mg | ORAL_CAPSULE | Freq: Three times a day (TID) | ORAL | Status: DC
  Administered 2015-07-28 – 2015-07-31 (×11): 500 mg via ORAL
  Filled 2015-07-28 (×12): qty 1

## 2015-07-28 MED ORDER — POLYETHYLENE GLYCOL 3350 17 G PO PACK: 17.0000 g | PACK | Freq: Every day | ORAL | Status: DC | PRN

## 2015-07-28 MED ORDER — PREDNISONE 20 MG PO TABS
20.0000 mg | ORAL_TABLET | Freq: Every day | ORAL | Status: DC
  Administered 2015-07-28 – 2015-07-31 (×4): 20 mg via ORAL
  Filled 2015-07-28 (×4): qty 1

## 2015-07-28 MED ORDER — INSULIN ASPART 100 UNIT/ML ~~LOC~~ SOLN
10.0000 [IU] | Freq: Three times a day (TID) | SUBCUTANEOUS | Status: DC
  Administered 2015-07-28 – 2015-07-31 (×8): 10 [IU] via SUBCUTANEOUS
  Filled 2015-07-28 (×7): qty 10

## 2015-07-28 MED ORDER — COLCHICINE 0.6 MG PO TABS
0.6000 mg | ORAL_TABLET | Freq: Once | ORAL | Status: AC
Start: 2015-07-28 — End: 2015-07-28
  Administered 2015-07-28: 0.6 mg via ORAL

## 2015-07-28 NOTE — Progress Notes (Addendum)
Weaned off morphine. Receiving oxycodone with relief. Ice pak to right shoulder and left ankle. Left ankle remain tender and warm. Receiving high dose of colchicine. Pain relieved much improved to right shoulder. Blood sugars elevated to almost 400. Started on prednisone and novolog 5units tid. Will continue to monitor

## 2015-07-28 NOTE — Progress Notes (Signed)
Subjective:    Right shoulder much better.  Able to move it more.  Left ankle less painful.  On colchicine and steroids.     Patient reports pain as mild.  Objective:   VITALS:   Filed Vitals:   07/28/15 0750 07/28/15 1128  BP: 133/86 124/80  Pulse: 76 89  Temp: 97.8 F (36.6 C) 98 F (36.7 C)  Resp: 16 16    Neurologically intact Neurovascular intact Sensation intact distally Intact pulses distally Dorsiflexion/Plantar flexion intact  LABS  Recent Labs  07/26/15 0637 07/28/15 0354  HGB 12.3* 12.6*  HCT 37.3* 39.2*  WBC 12.3* 8.5  PLT 129* 180     Recent Labs  07/26/15 0637 07/27/15 0656 07/28/15 0354  NA 131* 133* 132*  K 3.1* 3.5 4.5  BUN 35* 25* 27*  CREATININE 1.06 0.75 0.62  GLUCOSE 326* 150* 318*    No results for input(s): LABPT, INR in the last 72 hours.   Assessment/Plan:       Up with therapy

## 2015-07-28 NOTE — Progress Notes (Signed)
Patient ID: Edward Myers, male   DOB: Jul 18, 1955, 60 y.o.   MRN: [id number] [facility] PRO[facility]ESS [facility]  Edward Myers [id number] DOB: Jul 18, 1955 DOA: 07/25/2015 PCP: Vonita MossMark Crissman, MD  HPI/Subjective: Patient states that the pain in the right shoulder is much better than yesterday. The left top of the foot is still bothersome to him. His legs are more swollen than usual. Positive for constipation.  Objective: Filed Vitals:   07/28/15 0750 07/28/15 1128  BP: 133/86 124/80  Pulse: 76 89  Temp: 97.8 F (36.6 C) 98 F (36.7 C)  Resp: 16 16    Filed Weights   07/25/15 1239 07/25/15 1300  Weight: 133.811 kg (295 lb) 141.664 kg (312 lb 5 oz)    ROS: Review of Systems  Constitutional: Negative for fever and chills.  Eyes: Negative for blurred vision.  Respiratory: Negative for cough and shortness of breath.   Cardiovascular: Negative for chest pain.  Gastrointestinal: Positive for constipation. Negative for nausea, vomiting, abdominal pain and diarrhea.  Genitourinary: Negative for dysuria.  Musculoskeletal: Positive for joint pain.  Neurological: Negative for dizziness and headaches.   Exam: Physical Exam  Constitutional: He is oriented to person, place, and time.  HENT:  Right Ear: Tympanic membrane normal.  Left Ear: Tympanic membrane normal.  Nose: No mucosal edema.  Mouth/Throat: No oropharyngeal exudate or posterior oropharyngeal edema.  Eyes: Conjunctivae, EOM and lids are normal. Pupils are equal, round, and reactive to light.  Neck: Neck supple. No JVD present. Carotid bruit is not present. No tracheal deviation present. No thyroid mass and no thyromegaly present.  Cardiovascular: Regular rhythm, S2 normal and normal heart sounds.   No murmur heard. Pulses:      Carotid pulses are 2+ on the right side, and 2+ on the left side.      Dorsalis pedis pulses are 2+ on the right side, and 2+ on the left side.  Respiratory: No accessory muscle  usage. No respiratory distress. He has no wheezes. He has no rhonchi. He has no rales.  GI: Soft. Bowel sounds are normal. He exhibits no distension. There is no hepatosplenomegaly. There is no CVA tenderness.  Musculoskeletal:       Right shoulder: He exhibits decreased range of motion, tenderness, swelling and pain.       Left ankle: He exhibits decreased range of motion and swelling.  Pain the top of the left foot to palpation.  Lymphadenopathy:    He has no cervical adenopathy.    He has no axillary adenopathy.  Neurological: He is alert and oriented to person, place, and time. He has normal strength. No cranial nerve deficit or sensory deficit.  Reflex Scores:      Patellar reflexes are 2+ on the right side and 2+ on the left side. Skin: Skin is warm. Nails show no clubbing.  Chronic lower extremity discoloration.  Psychiatric: He has a normal mood and affect.    Data Reviewed: Basic Metabolic Panel:  Recent Labs Lab 07/23/15 1428 07/25/15 1248 07/26/15 0637 07/27/15 0656 07/28/15 0354  NA 127* 124* 131* 133* 132*  K 3.8 3.7 3.1* 3.5 4.5  CL 93* 90* 101 100* 100*  CO2 20* 20* 27 25 24   GLUCOSE 436* 452* 326* 150* 318*  BUN 28* 40* 35* 25* 27*  CREATININE 1.03 1.09 1.06 0.75 0.62  CALCIUM 8.8* 8.1* 7.6* 7.8* 8.3*  MG  --   --  2.6*  --   --   PHOS  --   --  2.8  --   --    Liver Function Tests:  Recent Labs Lab 07/23/15 1428 07/25/15 1248  AST 20 13*  ALT 36 26  ALKPHOS 86 97  BILITOT 2.1* 1.3*  PROT 6.9 6.4*  ALBUMIN 3.5 2.9*    Recent Labs Lab 07/25/15 1248  LIPASE 23   CBC:  Recent Labs Lab 07/23/15 1428 07/25/15 1248 07/26/15 0637 07/28/15 0354  WBC 13.3* 17.1* 12.3* 8.5  NEUTROABS 11.6* 14.2*  --   --   HGB 14.7 13.6 12.3* 12.6*  HCT 44.2 41.5 37.3* 39.2*  MCV 80.8 81.6 80.6 81.1  PLT 134* 149* 129* 180   Cardiac Enzymes:  Recent Labs Lab 07/25/15 1248 07/26/15 0637  TROPONINI 0.12* 0.08*    CBG:  Recent Labs Lab  07/27/15 1653 07/27/15 2127 07/28/15 0445 07/28/15 0753 07/28/15 1130  GLUCAP 265* 323* 334* 347* 362*    Recent Results (from the past 240 hour(s))  Blood Culture (routine x 2)     Status: None (Preliminary result)   Collection Time: 07/25/15 12:48 PM  Result Value Ref Range Status   Specimen Description BLOOD LEFT HAND  Final   Special Requests   Final    BOTTLES DRAWN AEROBIC AND ANAEROBIC AEROBIC ANAEROBIC   Culture NO GROWTH 2 DAYS  Final   Report Status PENDING  Incomplete  Urine culture     Status: None   Collection Time: 07/25/15 12:48 PM  Result Value Ref Range Status   Specimen Description URINE, RANDOM  Final   Special Requests NONE  Final   Culture NO GROWTH  Final   Report Status 07/26/2015 FINAL  Final  Blood Culture (routine x 2)     Status: None (Preliminary result)   Collection Time: 07/25/15  1:05 PM  Result Value Ref Range Status   Specimen Description BLOOD RIGHT FATTY CASTS  Final   Special Requests   Final    BOTTLES DRAWN AEROBIC AND ANAEROBIC ANA 5CC AERO 10CC   Culture NO GROWTH 2 DAYS  Final   Report Status PENDING  Incomplete  MRSA PCR Screening     Status: None   Collection Time: 07/25/15  8:45 PM  Result Value Ref Range Status   MRSA by PCR NEGATIVE NEGATIVE Final    Comment:        The GeneXpert MRSA Assay (FDA approved for NASAL specimens only), is one component of a comprehensive MRSA colonization surveillance program. It is not intended to diagnose MRSA infection nor to guide or monitor treatment for MRSA infections.      Studies: Ct Shoulder Right Wo Contrast  07/26/2015  CLINICAL DATA:  Right shoulder pain.  Fall this past weekend. EXAM: CT OF THE RIGHT SHOULDER WITHOUT CONTRAST TECHNIQUE: Multidetector CT imaging was performed according to the standard protocol. Multiplanar CT image reconstructions were also generated. COMPARISON:  07/23/2015 radiographs FINDINGS: No regional fracture or dislocation. Moderate  degenerative AC joint arthropathy with a small well corticated ossicle along the anterior margin of the AC joint, likely from a chronically fragmented spur from the distal anterior clavicle. Moderate distal clavicular spurring. Ossific structures within or along the distal subscapularis tendon observed. Extensive glenohumeral spurring. Chronically fragmented inferior spur from the anterior inferior glenoid. Subacromial morphology is type 2 (curved). No overt atrophy of the rotator cuff musculature. Today's CT exam is not accurate in assessing for rotator cuff tear or in assessing the labrum or biceps. I am suspicious that there is likely a small amount of  fluid in the subacromial subdeltoid bursa. There is also mid some edema tracking around the [facility] joint. Also there is edema tracking along the distal trapezius muscle and adjacent subcutaneous tissues. IMPRESSION: 1. No fracture or dislocation identified. 2. 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 3. Suspected heterotopic calcification along the distal subscapularis tendon with multiple well corticated ossific structures in this vicinity. Chronically fragmented inferior spurring from the anterior inferior glenoid. 4. I suspect subacromial subdeltoid bursitis. 5. Edema along the superficial margin of the trapezius muscle -trapezius strain not excluded. Electronically Signed   By: Gaylyn Rong M.D.   On: 07/26/2015 16:51    Scheduled Meds: . allopurinol  100 mg Oral Daily  . aspirin EC  81 mg Oral Daily  . atorvastatin  80 mg Oral Daily  . cephALEXin  500 mg Oral 3 times per day  . colchicine  0.6 mg Oral Q2H  . enoxaparin (LOVENOX) injection  40 mg Subcutaneous Q12H  . insulin aspart  0-15 Units Subcutaneous TID WC  . insulin aspart  0-5 Units Subcutaneous QHS  . insulin aspart  10 Units Subcutaneous TID WC  . [START ON 07/29/2015] insulin glargine  40 Units Subcutaneous Q24H  . loratadine  10  mg Oral Daily  . predniSONE  20 mg Oral Q breakfast  . tamsulosin  0.4 mg Oral Daily    Assessment/Plan:  1. Shock, hypotension, leukocytosis and tachycardia. Cultures are negative. Not sure if there is a cellulitis of the left lower extremity. Put on by mouth Keflex for right now. Blood pressure improved off medications 2. Right shoulder pain and poor range of motion. CT scan showing a lot of spurring around the before meals joints and other areas in the shoulder. Dr. Hyacinth Meeker orthopedic surgery who will injected yesterday. Pain better today Pain control with IV morphine and oral oxycodone. 3. Left foot pain- oral prednisone and 3 doses of colchicine today. Oral Keflex just in case infection. 4. Uncontrolled diabetes with hyperglycemia- increase Lantus dose and increase pre-meal short acting insulin and sliding scale 5. Hyponatremia- likely secondary to hydrochlorothiazide. Slowly improving 6. Elevated troponin likely demand ischemia from hypotension- continue aspirin. Stop heparin drip just in case arthrocentesis needed. Serial troponins 7. Hyperlipidemia unspecified on atorvastatin 8. BPH on Flomax 9. Constipation- hopefully the colchicine will cause diarrhea. If not I will give MiraLAX.  Code Status:     Code Status Orders        Start     Ordered   07/25/15 2028  Full code   Continuous     07/25/15 2027    Code Status History    Date Active Date Inactive Code Status Order ID Comments User Context   This patient has a current code status but no historical code status.    Advance Directive Documentation        Most Recent Value   Type of Advance Directive  Living will   Pre-existing out of facility DNR order (yellow form or pink MOST form)     "MOST" Form in Place?       Family Communication: Family at bedside. Disposition Plan: To be determined  Consultants:  Rheumatology  Cardiology  Orthopedic surgery  Time spent: 25 minutes  Alford Highland  [facility]  Hospitalists

## 2015-07-28 NOTE — Progress Notes (Signed)
PHARMACY -Electrolyte consult NOTE- follow up  Pharmacy Consult for Electrolyte Management    No Known Allergies  Patient Measurements: Height: 6\' 1"  (185.4 cm) Weight: (!) 312 lb 5 oz (141.664 kg) IBW/kg (Calculated) : 79.9   Vital Signs: Temp: 97.8 F (36.6 C) (01/14 0750) Temp Source: Oral (01/14 0750) BP: 133/86 mmHg (01/14 0750) Pulse Rate: 76 (01/14 0750) Intake/Output from previous day: 01/13 0701 - 01/14 0700 In: 640 [P.O.:640] Out: 925 [Urine:925] Intake/Output from this shift:   Vent settings for last 24 hours:    Labs:  Recent Labs  07/25/15 1248 07/26/15 0637 07/27/15 0656 07/28/15 0354  WBC 17.1* 12.3*  --  8.5  HGB 13.6 12.3*  --  12.6*  HCT 41.5 37.3*  --  39.2*  PLT 149* 129*  --  180  APTT 28  --   --   --   INR 1.32  --   --   --   CREATININE 1.09 1.06 0.75 0.62  MG  --  2.6*  --   --   PHOS  --  2.8  --   --   ALBUMIN 2.9*  --   --   --   PROT 6.4*  --   --   --   AST 13*  --   --   --   ALT 26  --   --   --   ALKPHOS 97  --   --   --   BILITOT 1.3*  --   --   --    BMET    Component Value Date/Time   NA 132* 07/28/2015 0354   K 4.5 07/28/2015 0354   CL 100* 07/28/2015 0354   CO2 24 07/28/2015 0354   GLUCOSE 318* 07/28/2015 0354   BUN 27* 07/28/2015 0354   CREATININE 0.62 07/28/2015 0354   CALCIUM 8.3* 07/28/2015 0354   GFRNONAA >60 07/28/2015 0354   GFRAA >60 07/28/2015 0354    Estimated Creatinine Clearance: 150.7 mL/min (by C-G formula based on Cr of 0.62).   Recent Labs  07/27/15 2127 07/28/15 0445 07/28/15 0753  GLUCAP 323* 334* 347*     Medications:  Scheduled:  . allopurinol  100 mg Oral Daily  . aspirin EC  81 mg Oral Daily  . atorvastatin  80 mg Oral Daily  . colchicine  0.6 mg Oral Daily  . enoxaparin (LOVENOX) injection  40 mg Subcutaneous Q12H  . insulin aspart  0-15 Units Subcutaneous TID WC  . insulin aspart  0-5 Units Subcutaneous QHS  . insulin aspart  5 Units Subcutaneous TID WC  . insulin  glargine  30 Units Subcutaneous Q24H  . loratadine  10 mg Oral Daily  . tamsulosin  0.4 mg Oral Daily   Infusions:     Assessment: Pharmacy consulted to manage electrolytes for 60 yo male intensive care patient.     Plan:  Electrolytes WNL. Will obtain electrolytes with am labs, if WNL, will transition to Q48hr monitoring.    1/14:  Electrolytes WNL. Will transition to Q48h monitoring.  Pharmacy will continue to monitor and adjust per consult.    Chantil Bari A 07/28/2015,8:49 AM

## 2015-07-29 LAB — GLUCOSE, CAPILLARY
GLUCOSE-CAPILLARY: 288 mg/dL — AB (ref 65–99)
Glucose-Capillary: 356 mg/dL — ABNORMAL HIGH (ref 65–99)
Glucose-Capillary: 222 mg/dL — ABNORMAL HIGH (ref 65–99)

## 2015-07-29 MED ORDER — VALACYCLOVIR HCL 500 MG PO TABS
1000.0000 mg | ORAL_TABLET | Freq: Two times a day (BID) | ORAL | Status: AC
  Administered 2015-07-29 (×2): 1000 mg via ORAL
  Filled 2015-07-29 (×2): qty 2

## 2015-07-29 MED ORDER — COLCHICINE 0.6 MG PO TABS
0.6000 mg | ORAL_TABLET | Freq: Every day | ORAL | Status: DC
  Administered 2015-07-29 – 2015-07-31 (×3): 0.6 mg via ORAL
  Filled 2015-07-29 (×3): qty 1

## 2015-07-29 NOTE — Progress Notes (Signed)
[facility]  SUBJECTIVE: no chest pain. Shoulder is better , left ankle still very painfull   Filed Vitals:   07/28/15 2033 07/28/15 2317 07/29/15 0348 07/29/15 0756  BP: 118/73 121/78 129/87 127/81  Pulse: 82 65 62 61  Temp: 97.8 F (36.6 C) 97.9 F (36.6 C) 98 F (36.7 C) 97.8 F (36.6 C)  TempSrc: Oral Oral Oral Oral  Resp: 18 18 18 16   Height:      Weight:      SpO2: 100% 94% 95% 99%    Intake/Output Summary (Last 24 hours) at 07/29/15 1032 Last data filed at 07/29/15 0900  Gross per 24 hour  Intake    480 ml  Output   1750 ml  Net  -1270 ml    LABS: Basic Metabolic Panel:  Recent Labs  16/04/9600/13/17 0656 07/28/15 0354  NA 133* 132*  K 3.5 4.5  CL 100* 100*  CO2 25 24  GLUCOSE 150* 318*  BUN 25* 27*  CREATININE 0.75 0.62  CALCIUM 7.8* 8.3*   Liver Function Tests: No results for input(s): AST, ALT, ALKPHOS, BILITOT, PROT, ALBUMIN in the last 72 hours. No results for input(s): LIPASE, AMYLASE in the last 72 hours. CBC:  Recent Labs  07/28/15 0354  WBC 8.5  HGB 12.6*  HCT 39.2*  MCV 81.1  PLT 180   Cardiac Enzymes: No results for input(s): CKTOTAL, CKMB, CKMBINDEX, TROPONINI in the last 72 hours. BNP: Invalid input(s): POCBNP D-Dimer: No results for input(s): DDIMER in the last 72 hours. Hemoglobin A1C: No results for input(s): HGBA1C in the last 72 hours. Fasting Lipid Panel: No results for input(s): CHOL, HDL, LDLCALC, TRIG, CHOLHDL, LDLDIRECT in the last 72 hours. Thyroid Function Tests: No results for input(s): TSH, T4TOTAL, T3FREE, THYROIDAB in the last 72 hours.  Invalid input(s): FREET3 Anemia Panel: No results for input(s): VITAMINB12, FOLATE, FERRITIN, TIBC, IRON, RETICCTPCT in the last 72 hours.   Physical Exam: Blood pressure 127/81, pulse 61, temperature 97.8 F (36.6 C), temperature source Oral, resp. rate 16, height 6\' 1"  (1.854 m), weight 141.664 kg (312 lb 5 oz), SpO2 99 %.    General  appearance: alert and cooperative Resp: clear to auscultation bilaterally Chest wall: no tenderness Cardio: regular rate and rhythm Extremities: left ankle pain Neurologic: Grossly normal  TELEMETRY: Reviewed telemetry pt in nsr:  ASSESSMENT AND PLAN:  Active Problems:   Shock (HCC)-no evidence of shock at present.    Demand ischemia (HCC)-no evidence of ischemia. Hemodynamics stable Ankle pain-being treated by rheum and ortho    Dalia HeadingFATH,KENNETH A., MD, [facility]FACC 07/29/2015 10:32 AM

## 2015-07-29 NOTE — Progress Notes (Addendum)
Patient ID: Edward Myers, male   DOB: [birth year], 61 y.o.   MRN: [id number] [facility] PROGRESS NOTE  Edward Myers HYQ:[id number] DOB: 1956-03-31 DOA: 07/25/2015 PCP: Vonita Moss, MD  HPI/Subjective: Patient still complains of severe pain in left ankle especially with walking.  He states his shoulders better than when he came in. Still has limited movement. He had bowel movements yesterday.  Objective: Filed Vitals:   07/29/15 0756 07/29/15 1126  BP: 127/81 116/74  Pulse: 61 79  Temp: 97.8 F (36.6 C) 98 F (36.7 C)  Resp: 16 16    Filed Weights   07/25/15 1239 07/25/15 1300  Weight: 133.811 kg (295 lb) 141.664 kg (312 lb 5 oz)    ROS: Review of Systems  Constitutional: Negative for fever and chills.  Eyes: Negative for blurred vision.  Respiratory: Negative for cough and shortness of breath.   Cardiovascular: Negative for chest pain.  Gastrointestinal: Negative for nausea, vomiting, abdominal pain, diarrhea and constipation.  Genitourinary: Negative for dysuria.  Musculoskeletal: Positive for joint pain.  Neurological: Negative for dizziness and headaches.   Exam: Physical Exam  Constitutional: He is oriented to person, place, and time.  HENT:  Right Ear: Tympanic membrane normal.  Left Ear: Tympanic membrane normal.  Nose: No mucosal edema.  Mouth/Throat: No oropharyngeal exudate or posterior oropharyngeal edema.  Eyes: Conjunctivae, EOM and lids are normal. Pupils are equal, round, and reactive to light.  Neck: Neck supple. No JVD present. Carotid bruit is not present. No tracheal deviation present. No thyroid mass and no thyromegaly present.  Cardiovascular: Regular rhythm, S2 normal and normal heart sounds.   No murmur heard. Pulses:      Carotid pulses are 2+ on the right side, and 2+ on the left side.      Dorsalis pedis pulses are 2+ on the right side, and 2+ on the left side.  Respiratory: No accessory muscle usage. No respiratory  distress. He has no wheezes. He has no rhonchi. He has no rales.  GI: Soft. Bowel sounds are normal. He exhibits no distension. There is no hepatosplenomegaly. There is no CVA tenderness.  Musculoskeletal:       Right shoulder: He exhibits decreased range of motion, tenderness, swelling and pain.       Left ankle: He exhibits decreased range of motion and swelling. Tenderness.  Pain the top of the left foot to palpation.  Lymphadenopathy:    He has no cervical adenopathy.    He has no axillary adenopathy.  Neurological: He is alert and oriented to person, place, and time. He has normal strength. No cranial nerve deficit or sensory deficit.  Reflex Scores:      Patellar reflexes are 2+ on the right side and 2+ on the left side. Skin: Skin is warm. Nails show no clubbing.  Chronic lower extremity discoloration.  Psychiatric: He has a normal mood and affect.    Data Reviewed: Basic Metabolic Panel:  Recent Labs Lab 07/23/15 1428 07/25/15 1248 07/26/15 0637 07/27/15 0656 07/28/15 0354  NA 127* 124* 131* 133* 132*  K 3.8 3.7 3.1* 3.5 4.5  CL 93* 90* 101 100* 100*  CO2 20* 20* 27 25 24   GLUCOSE 436* 452* 326* 150* 318*  BUN 28* 40* 35* 25* 27*  CREATININE 1.03 1.09 1.06 0.75 0.62  CALCIUM 8.8* 8.1* 7.6* 7.8* 8.3*  MG  --   --  2.6*  --   --   PHOS  --   --  2.8  --   --    Liver Function Tests:  Recent Labs Lab 07/23/15 1428 07/25/15 1248  AST 20 13*  ALT 36 26  ALKPHOS 86 97  BILITOT 2.1* 1.3*  PROT 6.9 6.4*  ALBUMIN 3.5 2.9*    Recent Labs Lab 07/25/15 1248  LIPASE 23   CBC:  Recent Labs Lab 07/23/15 1428 07/25/15 1248 07/26/15 0637 07/28/15 0354  WBC 13.3* 17.1* 12.3* 8.5  NEUTROABS 11.6* 14.2*  --   --   HGB 14.7 13.6 12.3* 12.6*  HCT 44.2 41.5 37.3* 39.2*  MCV 80.8 81.6 80.6 81.1  PLT 134* 149* 129* 180   Cardiac Enzymes:  Recent Labs Lab 07/25/15 1248 07/26/15 0637  TROPONINI 0.12* 0.08*    CBG:  Recent Labs Lab 07/28/15 0753  07/28/15 1130 07/28/15 1603 07/28/15 2127 07/29/15 1127  GLUCAP 347* 362* 315* 392* 288*    Recent Results (from the past 240 hour(s))  Blood Culture (routine x 2)     Status: None (Preliminary result)   Collection Time: 07/25/15 12:48 PM  Result Value Ref Range Status   Specimen Description BLOOD LEFT HAND  Final   Special Requests   Final    BOTTLES DRAWN AEROBIC AND ANAEROBIC AEROBIC 14ML ANAEROBIC 11ML   Culture NO GROWTH 3 DAYS  Final   Report Status PENDING  Incomplete  Urine culture     Status: None   Collection Time: 07/25/15 12:48 PM  Result Value Ref Range Status   Specimen Description URINE, RANDOM  Final   Special Requests NONE  Final   Culture NO GROWTH  Final   Report Status 07/26/2015 FINAL  Final  Blood Culture (routine x 2)     Status: None (Preliminary result)   Collection Time: 07/25/15  1:05 PM  Result Value Ref Range Status   Specimen Description BLOOD RIGHT FATTY CASTS  Final   Special Requests   Final    BOTTLES DRAWN AEROBIC AND ANAEROBIC ANA 5CC AERO 10CC   Culture NO GROWTH 3 DAYS  Final   Report Status PENDING  Incomplete  MRSA PCR Screening     Status: None   Collection Time: 07/25/15  8:45 PM  Result Value Ref Range Status   MRSA by PCR NEGATIVE NEGATIVE Final    Comment:        The GeneXpert MRSA Assay (FDA approved for NASAL specimens only), is one component of a comprehensive MRSA colonization surveillance program. It is not intended to diagnose MRSA infection nor to guide or monitor treatment for MRSA infections.       Scheduled Meds: . allopurinol  100 mg Oral Daily  . aspirin EC  81 mg Oral Daily  . atorvastatin  80 mg Oral Daily  . cephALEXin  500 mg Oral 3 times per day  . colchicine  0.6 mg Oral Daily  . enoxaparin (LOVENOX) injection  40 mg Subcutaneous Q12H  . insulin aspart  0-15 Units Subcutaneous TID WC  . insulin aspart  0-5 Units Subcutaneous QHS  . insulin aspart  10 Units Subcutaneous TID WC  . insulin glargine   40 Units Subcutaneous Q24H  . loratadine  10 mg Oral Daily  . predniSONE  20 mg Oral Q breakfast  . tamsulosin  0.4 mg Oral Daily  . valACYclovir  1,000 mg Oral BID    Assessment/Plan:  1. Shock, hypotension, leukocytosis and tachycardia. Cultures are negative. Not sure if there is a cellulitis of the left lower extremity. Put on by  mouth Keflex for right now. Blood pressure improved off medications 2. Right shoulder pain and poor range of motion. CT scan showing a lot of spurring around the before meals joints and other areas in the shoulder. Dr. Hyacinth Meeker orthopedic surgery injected area 2 days ago. Pain better today.  Pain control with IV morphine and oral oxycodone. 3. Left foot pain- oral prednisone taper. Daily colchicine. Keflex just in case infection. 4. Uncontrolled diabetes with hyperglycemia- increase Lantus again for 2 nights. dose and increase pre-meal short acting insulin and sliding scale 5. Hyponatremia- likely secondary to hydrochlorothiazide. Slowly improving 6. Elevated troponin likely demand ischemia from hypotension- continue aspirin. Stop heparin drip just in case arthrocentesis needed. Serial troponins 7. Hyperlipidemia unspecified on atorvastatin 8. BPH on Flomax 9. Constipation- resolved 10. Cold sore left mouth- valtrex 2 doses  Code Status:     Code Status Orders        Start     Ordered   07/25/15 2028  Full code   Continuous     07/25/15 2027    Code Status History    Date Active Date Inactive Code Status Order ID Comments User Context   This patient has a current code status but no historical code status.    Advance Directive Documentation        Most Recent Value   Type of Advance Directive  Living will   Pre-existing out of facility DNR order (yellow form or pink MOST form)     "MOST" Form in Place?       Family Communication: Family at bedside. Disposition Plan: To be determined  Consultants:  Rheumatology  Cardiology  Orthopedic  surgery  Time spent: 25 minutes  Alford Highland  [facility] Hospitalists

## 2015-07-29 NOTE — Progress Notes (Signed)
Physical Therapy Evaluation Patient Details Name: Edward Myers MRN: [id number] DOB: 1955-09-29 Today's Date: 07/29/2015   History of Present Illness  Edward Myers is a 60 y.o. male with a known history of gout, hypertension, hyperlipidemia, diabetes type 2 without complication, morbid obesity, glaucoma who presents to the hospital due to left ankle and right shoulder pain and overall just not feeling well. Patient had a fall this past weekend and hurt his right shoulder. He presented to the emergency room on Monday and was noted to have a urinary tract infection and discharged on oral Bactrim along with some Percocet for his left ankle and right shoulder pain. He has been taking the Percocet without much improvement in his symptoms. He shouldn't also was very weak and normally is very active and therefore the wife was concerned and brought him to the ER for further evaluation. Patient also had a fever of 101 this past weekend which was treated with some as needed Motrin and he attributed to an upper respiratory infection which she got from his wife. His upper respiratory symptoms have now resolved. He presents to the emergency room and was noted to be severely hypotensive with systolic blood pressures in the 70s to 80s. He is currently afebrile here. Hospitalist services were contacted for further treatment and evaluation given his hypotension, mildly elevated troponin and ongoing foot and shoulder pain  Clinical Impression  Patient is a previously independent 60 y.o. Male with acute L ankle and R shoulder pain, consequently from falling on outstretched arm. Patient has no restrictions but is able to use sling for R UE if desired. Patient reportedly has most pain relief with cortisone injection in R shoulder but still feels limited in ability to push through UE. On evaluation, patient found sitting at EOB. Per patient and patient's wife, patient had ambulated to restroom using RW, despite high fall  precautions. Upon evaluation, patient unable to follow safety cues from PT to improve balance and gait, ambulating ~5 feet with hop to patterning. Patient's session limited within pain tolerance. 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 Patient will benefit from continued PT services through stay at hospital and f/u at SNF unless status significantly improves prior to d/c.    Follow Up Recommendations SNF    Equipment Recommendations  Rolling walker with 5" wheels    Recommendations for Other Services       Precautions / Restrictions Precautions Precautions: Fall Required Braces or Orthoses: Sling (Not required) Restrictions Weight Bearing Restrictions: No      Mobility  Bed Mobility Overal bed mobility: Modified Independent             General bed mobility comments: Patient utilizes bed rails but requires no physical assistance to perform bed mobility  Transfers Overall transfer level: Needs assistance Equipment used: Rolling walker (2 wheeled) Transfers: Sit to/from Stand Sit to Stand: Mod assist;From elevated surface         General transfer comment: Patient requires moderate assistance, wanting to pull with L UE and push through R LE to move to standing. Unable to follow safety cues from PT properly.  Ambulation/Gait Ambulation/Gait assistance: Min assist Ambulation Distance (Feet): 5 Feet Assistive device: Rolling walker (2 wheeled)       General Gait Details: Patient demonstrates hop to gait with moderate fatigue  and pain. Unable to put L toes down or weightbear. Decreased safety awareness with cues from PT.  Stairs            Wheelchair Mobility    Modified Rankin (Stroke Patients Only)       Balance Overall balance assessment: Needs assistance Sitting-balance support: Feet  supported Sitting balance-Leahy Scale: Good     Standing balance support: Bilateral upper extremity supported Standing balance-Leahy Scale: Poor Standing balance comment: Patient requires tactile cues to steady in standing due to decreased ability to weightshift.                             Pertinent Vitals/Pain Pain Assessment: Faces Pain Score: 8  Faces Pain Scale: Hurts whole lot Pain Location: L anterior ankle/R shoulder Pain Descriptors / Indicators: Aching;Constant Pain Intervention(s): Limited activity within patient's tolerance;Monitored during session    Home Living Family/patient expects to be discharged to:: Private residence Living Arrangements: Spouse/significant other Available Help at Discharge: Family;Available PRN/intermittently Type of Home: House Home Access: Stairs to enter Entrance Stairs-Rails: Can reach both Entrance Stairs-Number of Steps: 6 Home Layout: One level Home Equipment: None      Prior Function Level of Independence: Independent               Hand Dominance   Dominant Hand: Right    Extremity/Trunk Assessment   Upper Extremity Assessment: RUE deficits/detail RUE Deficits / Details: AROM shoulder flexion ~45 deg., PROM 90 deg.; elbow WNL RUE: Unable to fully assess due to pain       Lower Extremity Assessment: LLE deficits/detail   LLE Deficits / Details: AROM DF/Inversion, painful and limited ~50%; painful resisted PF     Communication   Communication: No difficulties  Cognition Arousal/Alertness: Awake/alert Behavior During Therapy: WFL for tasks assessed/performed Overall Cognitive Status: Within Functional Limits for tasks assessed                      General Comments      Exercises        Assessment/Plan    PT Assessment Patient needs continued PT services  PT Diagnosis Difficulty walking;Acute pain   PT Problem List Decreased strength;Decreased range of motion;Decreased activity  tolerance;Decreased balance;Decreased mobility;Decreased knowledge of use of DME;Decreased safety awareness;Pain  PT Treatment Interventions DME instruction;Gait training;Stair training;Functional mobility training;Therapeutic activities;Therapeutic exercise;Balance training;Patient/family education   PT Goals (Current goals can be found in the Care Plan section) Acute Rehab PT Goals Patient Stated Goal: "To decrease pain" PT Goal Formulation: With patient/family Time For Goal Achievement: 08/12/15 Potential to Achieve Goals: Fair    Frequency Min 2X/week   Barriers to discharge Inaccessible home environment      Co-evaluation               End of Session Equipment Utilized During Treatment: Gait belt Activity Tolerance: Patient limited by pain Patient left: in bed;with call bell/phone within reach;with nursing/sitter in room;with family/visitor present Nurse Communication: Mobility status         Time: 0835-0900 PT Time Calculation (min) (ACUTE ONLY): 25 min   Charges:   PT Evaluation $PT Eval Low Complexity: 1 Procedure     PT G Codes:        Neita CarpJulie Ann Hazleigh Mccleave, PT, DPT 07/29/2015, 10:04 AM

## 2015-07-30 ENCOUNTER — Inpatient Hospital Stay: Payer: BLUE CROSS/BLUE SHIELD

## 2015-07-30 LAB — GLUCOSE, CAPILLARY
GLUCOSE-CAPILLARY: 181 mg/dL — AB (ref 65–99)
GLUCOSE-CAPILLARY: 303 mg/dL — AB (ref 65–99)
Glucose-Capillary: 151 mg/dL — ABNORMAL HIGH (ref 65–99)
Glucose-Capillary: 255 mg/dL — ABNORMAL HIGH (ref 65–99)

## 2015-07-30 LAB — BASIC METABOLIC PANEL
Anion gap: 4 — ABNORMAL LOW (ref 5–15)
BUN: 20 mg/dL (ref 6–20)
CALCIUM: 8.2 mg/dL — AB (ref 8.9–10.3)
CHLORIDE: 103 mmol/L (ref 101–111)
CO2: 27 mmol/L (ref 22–32)
Creatinine, Ser: 0.48 mg/dL — ABNORMAL LOW (ref 0.61–1.24)
GFR calc non Af Amer: >60 mL/min (ref >60)
Glucose, Bld: 148 mg/dL — ABNORMAL HIGH (ref 65–99)
POTASSIUM: 3.8 mmol/L (ref 3.5–5.1)
SODIUM: 134 mmol/L — AB (ref 135–145)

## 2015-07-30 NOTE — Clinical Social Work Note (Signed)
Clinical Social Work Assessment  Patient Details  Name: Edward Myers MRN: [id number] Date of Birth: [birth year]  Date of referral:  07/30/15               Reason for consult:  Facility Placement                Permission sought to share information with:  Case Manager Permission granted to share information::  Yes, Verbal Permission Granted  Name::        Agency::     Relationship::     Contact Information:     Housing/Transportation Living arrangements for the past 2 months:  Single Family Home Source of Information:  Patient, Adult Children, Spouse Patient Interpreter Needed:  None Criminal Activity/Legal Involvement Pertinent to Current Situation/Hospitalization:  No - Comment as needed Significant Relationships:  Adult Children, Spouse Lives with:  Spouse Do you feel safe going back to the place where you live?  Yes Need for family participation in patient care:  Yes (Comment)  Care giving concerns:  Patient lives in [place] with his wife Edward Myers.    Social Worker assessment / plan:  Holiday representative ([place]) received verbal consult from PT that recommendation is SNF and may progress to home health. CSW met with patient and his wife Edward Myers, son Edward Myers and a friend were at bedside. CSW introduced self and explained role of CSW department. Patient was alert and oriented and sitting up in the bed. Patient reported that he lives in [place] with his wife Edward Myers. Patient's only child Edward Myers at bedside lives in [place]. CSW explained that PT is recommending SNF. CSW also explained that patient's BCBS will have to approve SNF. Per wife she works at [employer] in the [employer]. Patient reported that he did not want to go to SNF that he wanted to go home. Per patient he feels comfortable returning home with a scooter and walker. Patient and wife are agreeable to home health. Per son he would pick patient up tomorrow if he is ready for discharge from the hospital. RN Case Manager aware of  above. Please reconsult if future social work needs arise. CSW signing off.   Employment status:  Retired Surveyor, [employer] Care PT Recommendations:  [place] (Patient refusing SNF ) Information / Referral to community resources:  Other (Comment Required) (RN Case Manager to arrange home health )  Patient/Family's Response to care: Patient is agreeable to home health.   Patient/Family's Understanding of and Emotional Response to Diagnosis, Current Treatment, and Prognosis:  Patient and wife were pleasant throughout assessment and thanked CSW for visit.   Emotional Assessment Appearance:  Appears stated age Attitude/Demeanor/Rapport:    Affect (typically observed):  Accepting, Adaptable, Pleasant Orientation:  Oriented to Self, Oriented to Place, Oriented to  Time, Oriented to Situation Alcohol / Substance use:  Not Applicable Psych involvement (Current and /or in the community):  No (Comment)  Discharge Needs  Concerns to be addressed:  Discharge Planning Concerns Readmission within the last 30 days:  No Current discharge risk:  Dependent with Mobility Barriers to Discharge:  Continued Medical Work up   Elwyn Reach 07/30/2015, 4:32 PM

## 2015-07-30 NOTE — Progress Notes (Signed)
Dr. Alberteen Spindleline called and asked for x-ray to be placed of left foot.

## 2015-07-30 NOTE — Care Management Note (Addendum)
Case Management Note  Patient Details  Name: Edward Myers MRN: [id number] Date of Birth: 11-17-1955  Subjective/Objective:                  Met with patient and his wife to discuss discharge planning. He is undecided on discharge needs although he does request a knee scooter. His PCP is with [facility]. He uses [facility] in [place] for [employer]. He is not concerned about returning home.  Action/Plan:   List of home health agencies left with patient's wife. Knee Scooter requested from [place]; Rx requested from Dr. Leslye Peer. RNCM will continue to follow.   Expected Discharge Date:                  Expected Discharge Plan:     In-House Referral:     Discharge planning Services  CM Consult  Post Acute Care Choice:  Home Health, Durable Medical Equipment Choice offered to:  Patient, Spouse  DME Arranged:    DME Agency:  [place]:    [place] Agency:     Status of Service:  In process, will continue to follow  Medicare Important Message Given:    Date Medicare IM Given:    Medicare IM give by:    Date Additional Medicare IM Given:    Additional Medicare Important Message give by:     If discussed at [place] of Stay Meetings, dates discussed:    Additional Comments: [facility] cannot provide bariatric knee scooter. I am checking with [facility] now. Their knee walker only cover up to 300 lbs too. Patient aware and wanting to recheck his weight to see if he's under 300lbs for knee scooter. If not, he requests a standard wheelchair with left leg extension.   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 A  cane, walker, crutch will not resolve  issue with performing activities of daily living. A wheelchair will allow patient to safely perform daily activities.  Patient can safely propel the  wheelchair in the home or has a caregiver who can provide assistance.   I will check with MD for standard wheelchair Rx if patient's weight is not ~300 lbs.  Per CNA patient's weight is 295.9lbs. Knee scooter will be mailed to patient's home address. Will with [facility] will notify patient of charges/delivery.   Marshell Garfinkel, RN 07/30/2015, 1:46 PM

## 2015-07-30 NOTE — Progress Notes (Addendum)
Patient ID: Edward Myers, male   DOB: 1956-03-31, 60 y.o.   MRN: [id number] [facility] PROGRESS NOTE  Edward Myers [id number] DOB: 1956-03-31 DOA: 07/25/2015 PCP: Vonita MossMark Crissman, MD  HPI/Subjective: Patient complaints of severe pain with walking or putting weight down on his foot. Shoulder is feeling better.  Objective: Filed Vitals:   07/30/15 0724 07/30/15 1143  BP: 148/89 122/79  Pulse: 73 71  Temp: 98 F (36.7 C) 98.6 F (37 C)  Resp: 18 17    Filed Weights   07/25/15 1239 07/25/15 1300  Weight: 133.811 kg (295 lb) 141.664 kg (312 lb 5 oz)    ROS: Review of Systems  Constitutional: Negative for fever and chills.  Eyes: Negative for blurred vision.  Respiratory: Negative for cough and shortness of breath.   Cardiovascular: Negative for chest pain.  Gastrointestinal: Negative for nausea, vomiting, abdominal pain, diarrhea and constipation.  Genitourinary: Negative for dysuria.  Musculoskeletal: Positive for joint pain.  Neurological: Negative for dizziness and headaches.   Exam: Physical Exam  Constitutional: He is oriented to person, place, and time.  HENT:  Right Ear: Tympanic membrane normal.  Left Ear: Tympanic membrane normal.  Nose: No mucosal edema.  Mouth/Throat: No oropharyngeal exudate or posterior oropharyngeal edema.  Eyes: Conjunctivae, EOM and lids are normal. Pupils are equal, round, and reactive to light.  Neck: Neck supple. No JVD present. Carotid bruit is not present. No tracheal deviation present. No thyroid mass and no thyromegaly present.  Cardiovascular: Regular rhythm, S2 normal and normal heart sounds.   No murmur heard. Pulses:      Carotid pulses are 2+ on the right side, and 2+ on the left side.      Dorsalis pedis pulses are 2+ on the right side, and 2+ on the left side.  Respiratory: No accessory muscle usage. No respiratory distress. He has no wheezes. He has no rhonchi. He has no rales.  GI: Soft. Bowel  sounds are normal. He exhibits no distension. There is no hepatosplenomegaly. There is no CVA tenderness.  Musculoskeletal:       Right shoulder: He exhibits decreased range of motion, tenderness, swelling and pain.       Left ankle: He exhibits decreased range of motion and swelling. Tenderness.  Pain the top of the left foot to palpation.  Lymphadenopathy:    He has no cervical adenopathy.    He has no axillary adenopathy.  Neurological: He is alert and oriented to person, place, and time. He has normal strength. No cranial nerve deficit or sensory deficit.  Reflex Scores:      Patellar reflexes are 2+ on the right side and 2+ on the left side. Skin: Skin is warm. Nails show no clubbing.  Chronic lower extremity discoloration.  Psychiatric: He has a normal mood and affect.    Data Reviewed: Basic Metabolic Panel:  Recent Labs Lab 07/25/15 1248 07/26/15 0637 07/27/15 0656 07/28/15 0354 07/30/15 0406  NA 124* 131* 133* 132* 134*  K 3.7 3.1* 3.5 4.5 3.8  CL 90* 101 100* 100* 103  CO2 20* 27 25 24 27   GLUCOSE 452* 326* 150* 318* 148*  BUN 40* 35* 25* 27* 20  CREATININE 1.09 1.06 0.75 0.62 0.48*  CALCIUM 8.1* 7.6* 7.8* 8.3* 8.2*  MG  --  2.6*  --   --   --   PHOS  --  2.8  --   --   --    Liver Function Tests:  Recent  Labs Lab 07/23/15 1428 07/25/15 1248  AST 20 13*  ALT 36 26  ALKPHOS 86 97  BILITOT 2.1* 1.3*  PROT 6.9 6.4*  ALBUMIN 3.5 2.9*    Recent Labs Lab 07/25/15 1248  LIPASE 23   CBC:  Recent Labs Lab 07/23/15 1428 07/25/15 1248 07/26/15 0637 07/28/15 0354  WBC 13.3* 17.1* 12.3* 8.5  NEUTROABS 11.6* 14.2*  --   --   HGB 14.7 13.6 12.3* 12.6*  HCT 44.2 41.5 37.3* 39.2*  MCV 80.8 81.6 80.6 81.1  PLT 134* 149* 129* 180   Cardiac Enzymes:  Recent Labs Lab 07/25/15 1248 07/26/15 0637  TROPONINI 0.12* 0.08*    CBG:  Recent Labs Lab 07/29/15 1127 07/29/15 1618 07/29/15 2119 07/30/15 0726 07/30/15 1142  GLUCAP 288* 356* 222* 151*  181*    Recent Results (from the past 240 hour(s))  Blood Culture (routine x 2)     Status: None (Preliminary result)   Collection Time: 07/25/15 12:48 PM  Result Value Ref Range Status   Specimen Description BLOOD LEFT HAND  Final   Special Requests   Final    BOTTLES DRAWN AEROBIC AND ANAEROBIC AEROBIC ANAEROBIC   Culture NO GROWTH 4 DAYS  Final   Report Status PENDING  Incomplete  Urine culture     Status: None   Collection Time: 07/25/15 12:48 PM  Result Value Ref Range Status   Specimen Description URINE, RANDOM  Final   Special Requests NONE  Final   Culture NO GROWTH  Final   Report Status 07/26/2015 FINAL  Final  Blood Culture (routine x 2)     Status: None (Preliminary result)   Collection Time: 07/25/15  1:05 PM  Result Value Ref Range Status   Specimen Description BLOOD RIGHT FATTY CASTS  Final   Special Requests   Final    BOTTLES DRAWN AEROBIC AND ANAEROBIC ANA 5CC AERO 10CC   Culture NO GROWTH 4 DAYS  Final   Report Status PENDING  Incomplete  MRSA PCR Screening     Status: None   Collection Time: 07/25/15  8:45 PM  Result Value Ref Range Status   MRSA by PCR NEGATIVE NEGATIVE Final    Comment:        The GeneXpert MRSA Assay (FDA approved for NASAL specimens only), is one component of a comprehensive MRSA colonization surveillance program. It is not intended to diagnose MRSA infection nor to guide or monitor treatment for MRSA infections.       Scheduled Meds: . allopurinol  100 mg Oral Daily  . aspirin EC  81 mg Oral Daily  . atorvastatin  80 mg Oral Daily  . cephALEXin  500 mg Oral 3 times per day  . colchicine  0.6 mg Oral Daily  . enoxaparin (LOVENOX) injection  40 mg Subcutaneous Q12H  . insulin aspart  0-15 Units Subcutaneous TID WC  . insulin aspart  0-5 Units Subcutaneous QHS  . insulin aspart  10 Units Subcutaneous TID WC  . loratadine  10 mg Oral Daily  . predniSONE  20 mg Oral Q breakfast  . tamsulosin  0.4 mg Oral Daily     Assessment/Plan:  1. Shock, hypotension, leukocytosis and tachycardia. Cultures are negative. Not sure if there is a cellulitis of the left lower extremity. Put on by mouth Keflex for right now. Blood pressure improved off medications 2. Right shoulder pain and poor range of motion. CT scan showing a lot of spurring around the before meals joints  and other areas in the shoulder. Dr. Hyacinth Meeker orthopedic surgery injected area 2 days ago. Pain better today.  Pain control with IV morphine and oral oxycodone. 3. Left foot pain.  I have been treating for gout with oral prednisone taper and daily colchicine. Keflex just in case infection.  Mri foot shows severe arthritis and ganglion cyst. Will get podiatry consult. Knee scooter script written. 4. Uncontrolled diabetes with hyperglycemia- sugars trending better now that we are lowering the prednisone dose.  5. Hyponatremia- likely secondary to hydrochlorothiazide. Slowly improving 6. Elevated troponin likely demand ischemia from hypotension- continue aspirin. Stop heparin drip just in case arthrocentesis needed. Serial troponins 7. Hyperlipidemia unspecified on atorvastatin 8. BPH on Flomax 9. Constipation- resolved 10. Cold sore left mouth- valtrex 2 doses  Code Status:     Code Status Orders        Start     Ordered   07/25/15 2028  Full code   Continuous     07/25/15 2027    Code Status History    Date Active Date Inactive Code Status Order ID Comments User Context   This patient has a current code status but no historical code status.    Advance Directive Documentation        Most Recent Value   Type of Advance Directive  Living will   Pre-existing out of facility DNR order (yellow form or pink MOST form)     "MOST" Form in Place?       Family Communication: Family at bedside. Disposition Plan: To be determined  Consultants:  Rheumatology  Cardiology  Orthopedic surgery  Time spent: 25 minutes  Alford Highland  [facility]

## 2015-07-30 NOTE — Progress Notes (Signed)
Physical Therapy Treatment Patient Details Name: Edward Myers MRN: [id number] DOB: [birth year] Today's Date: 07/30/2015    History of Present Illness Edward Myers is a 60 y.o. male with a known history of gout, hypertension, hyperlipidemia, diabetes type 2 without complication, morbid obesity, glaucoma who presents to the hospital due to left ankle and right shoulder pain and overall just not feeling well. Patient had a fall this past weekend and hurt his right shoulder. He presented to the emergency room on Monday and was noted to have a urinary tract infection and discharged on oral Bactrim along with some Percocet for his left ankle and right shoulder pain. He has been taking the Percocet without much improvement in his symptoms. He shouldn't also was very weak and normally is very active and therefore the wife was concerned and brought him to the ER for further evaluation. Patient also had a fever of 101 this past weekend which was treated with some as needed Motrin and he attributed to an upper respiratory infection which she got from his wife. His upper respiratory symptoms have now resolved. He presents to the emergency room and was noted to be severely hypotensive with systolic blood pressures in the 70s to 80s. He is currently afebrile here. Hospitalist services were contacted for further treatment and evaluation given his hypotension, mildly elevated troponin and ongoing foot and shoulder pain    PT Comments    Pt with slight improvement this date with bed mobility, transfers, and gait.  Pt continues to be limited by severe L ankle/foot pain and R shoulder pain, although pt was able to weight bear through R UE on RW with gait this session.  Pt required Min A for sit<>stand transfers from elevated surface and amb 12' with BRW with Min A.  Pt has 6 steps to enter his home.  Wife reports that she was able to assist pt in/out of home earlier this week using a crutch and that she could do it  again, but this skill has not been formally addressed by PT due to limited tolerance to therapy sessions.  Pt crying at end of session and seemed very overwhelmed.  No change in PT recs this date due to slow progress and pain.   Follow Up Recommendations  SNF (May progress to HHPT if able to tolerate weight on L foot)     Equipment Recommendations  Rolling walker with 5" wheels (Bariatric (Pt weighs 312 lbs))    Recommendations for Other Services       Precautions / Restrictions Precautions Precautions: Fall Required Braces or Orthoses: Sling Restrictions Weight Bearing Restrictions: No    Mobility  Bed Mobility Overal bed mobility: Modified Independent             General bed mobility comments: Heavy use of bed rails to rotate hips to EOB and to scoot up in bed in supine.  Transfers Overall transfer level: Needs assistance Equipment used: Rolling walker (2 wheeled) (bariatric) Transfers: Sit to/from Stand Sit to Stand: Min assist;From elevated surface         General transfer comment: Pt able to position self at EOB and maintain good body mechanics for Sit>stand transfer; pt required a significant anterior weight translation to lift hips off bed and rise into standing on R foot and using B UE's on RW.  Ambulation/Gait Ambulation/Gait assistance: Min assist Ambulation Distance (Feet): 20 Feet Assistive device: Rolling walker (2 wheeled) (bariatric)       General Gait Details: Hop-to gait  pattern holding L LE off floor the entire time despite cues to use foor for WBAT/balance.  Pt reports pain in R hip with hop-to gait due to jarring with every step.   Stairs            Wheelchair Mobility    Modified Rankin (Stroke Patients Only)       Balance Overall balance assessment: Modified Independent Sitting-balance support: Feet supported Sitting balance-Leahy Scale: Good     Standing balance support: Bilateral upper extremity supported Standing  balance-Leahy Scale: Fair Standing balance comment: Due to maintaining single leg stance while standing, pt requires B UE support to maintain dynamic balance.                    Cognition Arousal/Alertness: Awake/alert Behavior During Therapy: WFL for tasks assessed/performed Overall Cognitive Status: Within Functional Limits for tasks assessed                      Exercises Other Exercises Other Exercises: Supine: general LE therex including AP, QS, and HS  Sitting: sitting balance at EOB, weight shifts forward and back for repositioning    General Comments        Pertinent Vitals/Pain Pain Location: L foot, especially anterior ankle; tender to light touch and any movement or pressure. Pain Descriptors / Indicators: Aching;Constant Pain Intervention(s): Limited activity within patient's tolerance;Monitored during session    Home Living                      Prior Function            PT Goals (current goals can now be found in the care plan section) Acute Rehab PT Goals Patient Stated Goal: "To decrease pain" PT Goal Formulation: With patient/family Time For Goal Achievement: 08/12/15 Potential to Achieve Goals: Fair    Frequency  Min 2X/week    PT Plan      Co-evaluation             End of Session Equipment Utilized During Treatment: Gait belt Activity Tolerance: Patient limited by pain Patient left: in bed;with call bell/phone within reach;with nursing/sitter in room;with family/visitor present     Time: 1230-1310 PT Time Calculation (min) (ACUTE ONLY): 40 min  Charges:  $Gait Training: 8-22 mins $Therapeutic Exercise: 8-22 mins                    G Codes:      Nylan Nevel A Romari Gasparro 07/30/2015, 1:48 PM

## 2015-07-30 NOTE — Progress Notes (Signed)
Inpatient Diabetes Program Recommendations  AACE/ADA: New Consensus Statement on Inpatient Glycemic Control (2015)  Target Ranges:  Prepandial:   less than 140 mg/dL      Peak postprandial:   less than 180 mg/dL (1-2 hours)      Critically ill patients:  140 - 180 mg/dL  Results for Sheliah HatchRKER, Johnie KEITH (MRN [id number]) as of 07/30/2015 09:32  Ref. Range 07/28/2015 21:27 07/29/2015 11:27 07/29/2015 16:18 07/29/2015 21:19 07/30/2015 07:26  Glucose-Capillary Latest Ref Range: 65-99 mg/dL 846392 (H) 962288 (H) 952356 (H) 222 (H) 151 (H)   Review of Glycemic Control  Diabetes history: DM2 Outpatient Diabetes medications: 70/30 25 units daily, Metformin 1000 mg BID Current orders for Inpatient glycemic control: Lantus 40 units Q24H, Novolog 0-15 units TID with meals, Novolog 0-5 units HS, Novolog 10 units TID with meals for meal coverage  Inpatient Diabetes Program Recommendations: Insulin - Meal Coverage: If Prednisone is continued as ordered, please consider increasing meal coverage to 15 units TID with meals. HgbA1C: Please order an A1C to evaluate glycemic control over the past 2-3 months.  Thanks, Orlando PennerMarie Rooney Gladwin, RN, MSN, CDE Diabetes Coordinator Inpatient Diabetes Program [phone] (Team Pager from 8am to 5pm) [phone] ([facility] office) [phone] [facility] office) [phone] [facility] office)

## 2015-07-30 NOTE — Consult Note (Signed)
Reason for Consult: Pain in the Myers foot for 1 week duration Referring Physician: [facility]  Edward Myers is an 60 y.o. male.  HPI: Edward Myers relates a history of pain in his Myers foot for just over 1 week. States that last week during the snowstorm on Saturday night he woke up in the middle of the night with severe pain in his Myers foot. States he was unable to bear any weight on the foot and subsequently fell injuring his right shoulder. Last week he also began running a fever and he did present to the emergency department. He was admitted. He has continued to have severe pain and swelling in the Myers foot. Is been on prednisone and colchicine with no significant improvement until today. He states that since this morning it has started to feel better as of right now with less tenderness, especially on touch. States there has been concern whether it is gout versus infection. He did have an MRI performed which appeared to show edema in the bones with some degenerative arthritic changes  Past Medical History  Diagnosis Date  . Chronic gouty arthropathy   . Glaucoma   . Hyperlipidemia   . Hypertension   . Rhinitis medicamentosa   . Diabetes mellitus without complication ([place])   . Morbid obesity ([place])   . UTI (urinary tract infection)     Past Surgical History  Procedure Laterality Date  . Tonsillectomy    . Circumcision      Family History  Problem Relation Age of Onset  . Heart attack Mother   . Diabetes Mother     Social History:  reports that he quit smoking about 5 years ago. His smoking use included Cigars. He does not have any smokeless tobacco history on file. He reports that he drinks alcohol. His drug history is not on file.  Allergies: No Known Allergies  Medications:  Scheduled: . allopurinol  100 mg Oral Daily  . aspirin EC  81 mg Oral Daily  . atorvastatin  80 mg Oral Daily  . cephALEXin  500 mg Oral 3 times per day  . colchicine  0.6 mg  Oral Daily  . enoxaparin (LOVENOX) injection  40 mg Subcutaneous Q12H  . insulin aspart  0-15 Units Subcutaneous TID WC  . insulin aspart  0-5 Units Subcutaneous QHS  . insulin aspart  10 Units Subcutaneous TID WC  . loratadine  10 mg Oral Daily  . predniSONE  20 mg Oral Q breakfast  . tamsulosin  0.4 mg Oral Daily    Results for orders placed or performed during the hospital encounter of 07/25/15 (from the past 48 hour(s))  Glucose, capillary     Status: Abnormal   Collection Time: 07/28/15  9:27 PM  Result Value Ref Range   Glucose-Capillary 392 (H) 65 - 99 mg/dL   Comment 1 Notify RN   Glucose, capillary     Status: Abnormal   Collection Time: 07/29/15 11:27 AM  Result Value Ref Range   Glucose-Capillary 288 (H) 65 - 99 mg/dL   Comment 1 Notify RN   Glucose, capillary     Status: Abnormal   Collection Time: 07/29/15  4:18 PM  Result Value Ref Range   Glucose-Capillary 356 (H) 65 - 99 mg/dL   Comment 1 Notify RN   Glucose, capillary     Status: Abnormal   Collection Time: 07/29/15  9:19 PM  Result Value Ref Range   Glucose-Capillary 222 (H) 65 -  99 mg/dL   Comment 1 Notify RN   Basic metabolic panel     Status: Abnormal   Collection Time: 07/30/15  4:06 AM  Result Value Ref Range   Sodium 134 (L) 135 - 145 mmol/L   Potassium 3.8 3.5 - 5.1 mmol/L   Chloride 103 101 - 111 mmol/L   CO2 27 22 - 32 mmol/L   Glucose, Bld 148 (H) 65 - 99 mg/dL   BUN 20 6 - 20 mg/dL   Creatinine, Ser 0.48 (L) 0.61 - 1.24 mg/dL   Calcium 8.2 (L) 8.9 - 10.3 mg/dL   GFR calc non Af Amer >60 >60 mL/min   GFR calc Af Amer >60 >60 mL/min    Comment: (NOTE) The eGFR has been calculated using the CKD EPI equation. This calculation has not been validated in all clinical situations. eGFR's persistently <60 mL/min signify possible Chronic Kidney Disease.    Anion gap 4 (L) 5 - 15  Glucose, capillary     Status: Abnormal   Collection Time: 07/30/15  7:26 AM  Result Value Ref Range    Glucose-Capillary 151 (H) 65 - 99 mg/dL   Comment 1 Notify RN   Glucose, capillary     Status: Abnormal   Collection Time: 07/30/15 11:42 AM  Result Value Ref Range   Glucose-Capillary 181 (H) 65 - 99 mg/dL   Comment 1 Notify RN   Glucose, capillary     Status: Abnormal   Collection Time: 07/30/15  3:49 PM  Result Value Ref Range   Glucose-Capillary 255 (H) 65 - 99 mg/dL   Comment 1 Notify RN     Edward Myers Wo Contrast  07/30/2015  CLINICAL DATA:  Anterior Myers ankle and foot pain and swelling. EXAM: MRI OF THE Myers HINDFOOT WITHOUT CONTRAST TECHNIQUE: Multiplanar, multisequence Edward imaging was performed. No intravenous contrast was administered. COMPARISON:  Radiographs dated 07/23/2015 FINDINGS: TENDONS Peroneal: Minimal tendinopathy of the peroneus longus tendon at the level of the lateral malleolus. Posteromedial: Normal. Anterior: Normal. Achilles: Hypertrophy and chronic degenerative changes of the distal Achilles tendon with calcific tendinopathy distally . Plantar Fascia: Negative. LIGAMENTS Lateral: Normal. Medial: Normal. CARTILAGE Ankle Joint: Normal. Subtalar Joints/Sinus Tarsi: Minimal chondromalacia of the posterior facet of the talus. Bones: Focal severe arthritis between the lateral aspect of the navicular and the cuboid and lateral cuneiform with osteophyte formation. Subcortical edema, and a 28 by 13 x 4 mm overlying the ganglion cyst. SOFT TISSUES: Circumferential subcutaneous edema at the ankle extending onto the dorsum of the foot. This is nonspecific. IMPRESSION: 1. 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 2. Chronic distal Achilles tendinopathy. 3. Nonspecific subcutaneous edema. Electronically Signed   By: Lorriane Shire M.D.   On: 07/30/2015 11:25   Dg Foot Complete Myers  07/30/2015  CLINICAL DATA:  Sudden onset of Myers foot pain, no known injury EXAM: Myers FOOT - COMPLETE 3+ VIEW  COMPARISON:  07/23/2015 FINDINGS: Three views of the Myers foot submitted. No acute fracture or subluxation. Degenerative changes are noted with dorsal spurring tarsal region. Mild dorsal spurring anterior aspect of the talus. There is plantar and posterior spurring of calcaneus. Degenerative changes at the articulation of navicular with cuneiform bone. Mild spurring at the base of fifth metatarsal. IMPRESSION: No acute fracture or subluxation. Degenerative changes as described above. There is plantar and posterior spurring of calcaneus. Electronically Signed   By: Orlean Bradford.D.  On: 07/30/2015 15:46    Review of Systems  Constitutional: Negative.   HENT: Negative.   Eyes: Negative.   Respiratory: Negative.   Cardiovascular: Negative.   Gastrointestinal: Negative.   Genitourinary: Negative.   Musculoskeletal:       Significant pain in his Myers foot and ankle. Has improved some today from this morning until now  Skin:       Relates some swelling and redness in his Myers foot with increased skin temperature.  Neurological:       Denies any numbness or paresthesias associated with his diabetes  Endo/Heme/Allergies: Negative.   Psychiatric/Behavioral: Negative.    Blood pressure 120/83, pulse 81, temperature 98.4 F (36.9 C), temperature source Oral, resp. rate 18, height _0  (1.854 m), weight 134.038 kg (295 lb 8 oz), SpO2 100 %. Physical Exam  Cardiovascular:  DP and PT pulses are fully palpable bilateral, 2 over 4  Musculoskeletal:  Very guarded range of motion in the Myers foot and ankle. Normal range of motion in the right foot and ankle. Muscle testing is deferred. Exquisite pain is present on direct palpation over the dorsal midfoot region  Neurological:  Protective threshold with a monofilament wire is intact and symmetric to both feet and digits. Proprioception is intact bilateral with no deficits  Skin:  The skin is warm dry and supple. Some edema is present bilaterally but more  so on the Myers foot and ankle area than on the right. Erythema is noted over the dorsal midfoot with some increased skin temperature on palpation. No ecchymosis or open lesions.    Assessment/Plan: Assessment: Pain and swelling Myers foot, appearing more like gout then infection at this point. Diabetes  Plan: Discussed with the patient that at this point I think his symptoms are more consistent with gout than infection. Uric acid levels taken twice were 9.4 and 9.8, both well above the normal range The MRI did not clearly show erosive changes in the bone, nor did his plain film x-rays. Changes are more consistent with arthritis and possibly could be from the inflammation of gout. He has been on allopurinol outpatient but only 100 mg daily. I would recommend that he continue with trying to obtain a knee scooter to keep pressure off of the foot. We also discussed the possibility of immobilization in a fracture boot but this would need to be obtained outpatient in the clinic as the hospital does not carry these. Discussed the possibility of early Charcot changes on the MRI but with normal sensation in the foot this is not as likely. At this point I would hold off on any NSAIDs because of his elevated kidney functions. Would recommend follow-up outpatient with podiatry as well as possibly rheumatology for other treatment options. At this point would not recommend any type of I&D or open procedure. If concern is still present about the possibility of infection may be a white blood cell labeled bone scan. We'll plan on following up with the patient outpatient.  Isacc Turney W. 07/30/2015, 5:35 PM

## 2015-07-30 NOTE — Progress Notes (Signed)
PHARMACY -Electrolyte consult NOTE- follow up  Pharmacy Consult for Electrolyte Management    No Known Allergies  Patient Measurements: Height: 6\' 1"  (185.4 cm) Weight: 295 lb 8 oz (134.038 kg) IBW/kg (Calculated) : 79.9   Vital Signs: Temp: 98.4 F (36.9 C) (01/16 1550) Temp Source: Oral (01/16 1550) BP: 120/83 mmHg (01/16 1550) Pulse Rate: 81 (01/16 1550) Intake/Output from previous day: 01/15 0701 - 01/16 0700 In: 600 [P.O.:600] Out: 800 [Urine:800] Intake/Output from this shift: Total I/O In: 240 [P.O.:240] Out: -  Vent settings for last 24 hours:    Labs:  Recent Labs  07/28/15 0354 07/30/15 0406  WBC 8.5  --   HGB 12.6*  --   HCT 39.2*  --   PLT 180  --   CREATININE 0.62 0.48*   BMET    Component Value Date/Time   NA 134* 07/30/2015 0406   K 3.8 07/30/2015 0406   CL 103 07/30/2015 0406   CO2 27 07/30/2015 0406   GLUCOSE 148* 07/30/2015 0406   BUN 20 07/30/2015 0406   CREATININE 0.48* 07/30/2015 0406   CALCIUM 8.2* 07/30/2015 0406   GFRNONAA >60 07/30/2015 0406   GFRAA >60 07/30/2015 0406    Estimated Creatinine Clearance: 146.3 mL/min (by C-G formula based on Cr of 0.48).   Recent Labs  07/30/15 0726 07/30/15 1142 07/30/15 1549  GLUCAP 151* 181* 255*     Medications:  Scheduled:  . allopurinol  100 mg Oral Daily  . aspirin EC  81 mg Oral Daily  . atorvastatin  80 mg Oral Daily  . cephALEXin  500 mg Oral 3 times per day  . colchicine  0.6 mg Oral Daily  . enoxaparin (LOVENOX) injection  40 mg Subcutaneous Q12H  . insulin aspart  0-15 Units Subcutaneous TID WC  . insulin aspart  0-5 Units Subcutaneous QHS  . insulin aspart  10 Units Subcutaneous TID WC  . loratadine  10 mg Oral Daily  . predniSONE  20 mg Oral Q breakfast  . tamsulosin  0.4 mg Oral Daily   Infusions:     Assessment: Pharmacy consulted to manage electrolytes for 60 yo male intensive care patient.     Plan:  Electrolytes WNL. Will obtain electrolytes in  Q48hr monitoring.    Pharmacy will continue to monitor and adjust per consult.    Tharon Kitch L 07/30/2015,4:07 PM

## 2015-07-31 LAB — BLOOD GAS, ARTERIAL
ACID-BASE DEFICIT: 1.4 mmol/L (ref 0.0–2.0)
ALLENS TEST (PASS/FAIL): POSITIVE — AB
Bicarbonate: 21.1 mEq/L (ref 21.0–28.0)
FIO2: 0.21
O2 SAT: 94.5 %
PATIENT TEMPERATURE: 37
pCO2 arterial: 29 mmHg — ABNORMAL LOW (ref 32.0–48.0)
pH, Arterial: 7.47 — ABNORMAL HIGH (ref 7.350–7.450)
pO2, Arterial: 68 mmHg — ABNORMAL LOW (ref 83.0–108.0)

## 2015-07-31 LAB — CULTURE, BLOOD (ROUTINE X 2)
Culture: NO GROWTH
Culture: NO GROWTH

## 2015-07-31 LAB — GLUCOSE, CAPILLARY
GLUCOSE-CAPILLARY: 235 mg/dL — AB (ref 65–99)
Glucose-Capillary: 146 mg/dL — ABNORMAL HIGH (ref 65–99)

## 2015-07-31 LAB — HEMOGLOBIN A1C: HEMOGLOBIN A1C: 15.6 % — AB (ref 4.0–6.0)

## 2015-07-31 MED ORDER — PREDNISONE 5 MG PO TABS: ORAL_TABLET | ORAL | Status: DC

## 2015-07-31 MED ORDER — OXYCODONE HCL 5 MG PO TABS: 5.0000 mg | ORAL_TABLET | Freq: Four times a day (QID) | ORAL | Status: DC | PRN

## 2015-07-31 MED ORDER — COLCHICINE 0.6 MG PO TABS: 0.6000 mg | ORAL_TABLET | Freq: Every day | ORAL | Status: DC

## 2015-07-31 MED ORDER — INSULIN ASPART 100 UNIT/ML ~~LOC~~ SOLN
13.0000 [IU] | Freq: Three times a day (TID) | SUBCUTANEOUS | Status: DC
  Administered 2015-07-31: 13 [IU] via SUBCUTANEOUS
  Filled 2015-07-31: qty 13

## 2015-07-31 MED ORDER — INSULIN GLARGINE 100 UNIT/ML ~~LOC~~ SOLN
40.0000 [IU] | SUBCUTANEOUS | Status: DC
  Administered 2015-07-31: 40 [IU] via SUBCUTANEOUS
  Filled 2015-07-31: qty 0.4

## 2015-07-31 NOTE — Progress Notes (Addendum)
Inpatient Diabetes Program Recommendations  AACE/ADA: New Consensus Statement on Inpatient Glycemic Control (2015)  Target Ranges:  Prepandial:   less than 140 mg/dL      Peak postprandial:   less than 180 mg/dL (1-2 hours)      Critically ill patients:  140 - 180 mg/dL  Results for JSHON, IBE (MRN [id number]) as of 07/31/2015 09:23  Ref. Range 07/30/2015 07:26 07/30/2015 11:42 07/30/2015 15:49 07/30/2015 20:51 07/31/2015 07:57  Glucose-Capillary Latest Ref Range: 65-99 mg/dL 696 (H) 295 (H) 284 (H) 303 (H) 146 (H)   Review of Glycemic Control  Diabetes history: DM2 Outpatient Diabetes medications: 70/30 25 units daily, Metformin 1000 mg BID Current orders for Inpatient glycemic control: Novolog 0-15 units TID with meals, Novolog 0-5 units HS, Novolog 10 units TID with meals for meal coverage  Inpatient Diabetes Program Recommendations: Insulin-Basal: Patient was ordered Lantus 40 units Q24H; however, noted Lantus was discontinued yesterday at 12:45. Patient last received Lantus 40 units on 07/30/15 at 12:37. Please consider re-ordering Lantus 40 units daily to start today. Insulin - Meal Coverage: If Prednisone is continued as ordered, please consider increasing meal coverage to 13 units TID with meals. HgbA1C: Please order an A1C to evaluate glycemic control over the past 2-3 months.  Thanks, Orlando Penner, RN, MSN, CDE Diabetes Coordinator Inpatient Diabetes Program [phone] (Team Pager from 8am to 5pm) [phone] ([facility] office) [phone] [facility] office) [phone] [facility] office)

## 2015-07-31 NOTE — Progress Notes (Signed)
DISCHARGE NOTE:  Pt given discharge instructions, and prescriptions. Pt verbalized understanding. Pt wheeled to car by staff.  

## 2015-07-31 NOTE — Care Management (Signed)
Patient would like a rolling walker prior to discharge as he waits for knee scooter to be delivered. He would like to use [facility] for home health. Referral called to [facility] with [facility]. RNCM will continue to follow.

## 2015-07-31 NOTE — Discharge Instructions (Signed)
Heart healthy and ADA diet. Activity as tolerated. HHPT with rolling walker.

## 2015-07-31 NOTE — Care Management (Signed)
Rolling walker delivered by [facility] under private pay. Barbara Cower with [facility] notified of patient discharge to home today. No further RNCM needs. Case closed.

## 2015-07-31 NOTE — Discharge Summary (Signed)
[facility] [place] at [facility]   PATIENT NAME: Edward Myers    MR#:  [id number]  DATE OF BIRTH:  1955/09/16  DATE OF ADMISSION:  07/25/2015 ADMITTING PHYSICIAN: Houston Siren, MD  DATE OF DISCHARGE: 07/31/2015  4:00 PM  PRIMARY CARE PHYSICIAN: Vonita Moss, MD    ADMISSION DIAGNOSIS:  NSTEMI (non-ST elevated myocardial infarction) (HCC) [I21.4] Acute gouty arthropathy [M10.9] Sepsis, due to unspecified organism (HCC) [A41.9] Hypotension, unspecified hypotension type [I95.9] Type 2 diabetes mellitus with hyperglycemia, with long-term current use of insulin (HCC) [E11.65, Z79.4]   DISCHARGE DIAGNOSIS:  Hypertension Right shoulder osteoarthritis Left ankle gout  SECONDARY DIAGNOSIS:   Past Medical History  Diagnosis Date  . Chronic gouty arthropathy   . Glaucoma   . Hyperlipidemia   . Hypertension   . Rhinitis medicamentosa   . Diabetes mellitus without complication (HCC)   . Morbid obesity (HCC)   . UTI (urinary tract infection)     HOSPITAL COURSE:   1. Shock, hypotension, leukocytosis and tachycardia. Cultures are negative. Not sure if there is a cellulitis of the left lower extremity. He was put on by mouth Keflex. Blood pressure improved off medications. 2. Right shoulder pain and poor range of motion. CT scan showing a lot of spurring around the before meals joints and other areas in the shoulder. Dr. Hyacinth Meeker orthopedic surgery injected area 3 days ago. Pain better today. Pain control with IV morphine and oral oxycodone. 3. Left foot pain due to gout. He has been treated with with oral prednisone taper and daily colchicine. Keflex just in case infection. Mri foot shows severe arthritis and ganglion cyst. Per Dr. Alberteen Spindle,  podiatry consult. Continue prednisone, allopurinol and colchicine. Follow-up with him and the rheumatologist as outpatient. 4. Uncontrolled diabetes with hyperglycemia- sugars trending better now that we are lowering the  prednisone dose. The patient needs to follow-up with endocrinologist as outpatient. 5. Hyponatremia- likely secondary to hydrochlorothiazide. Slowly improved. 6. Elevated troponin likely demand ischemia from hypotension- continue aspirin. Stopped heparin drip. 7. Hyperlipidemia unspecified on atorvastatin 8. BPH on Flomax 9. Constipation- resolved 10. Cold sore left mouth- valtrex 2 doses  DISCHARGE CONDITIONS:   Stable, discharged to home with home health and PT today.  CONSULTS OBTAINED:  Treatment Team:  Kandyce Rud., MD Dalia Heading, MD Deeann Saint, MD Linus Galas, MD  DRUG ALLERGIES:  No Known Allergies  DISCHARGE MEDICATIONS:   Discharge Medication List as of 07/31/2015 12:59 PM    START taking these medications   Details  colchicine 0.6 MG tablet Take 1 tablet (0.6 mg total) by mouth daily., Starting 07/31/2015, Until Discontinued, Print    predniSONE (DELTASONE) 5 MG tablet 4 tab po daily for 2 days, 2 tab po daily for 2 days, 1 tab po daily for 4 days., Print      CONTINUE these medications which have NOT CHANGED   Details  allopurinol (ZYLOPRIM) 100 MG tablet Take 100 mg by mouth daily. , Until Discontinued, Historical Med    amLODipine (NORVASC) 10 MG tablet Take 1 tablet (10 mg total) by mouth daily., Starting 01/29/2015, Until Discontinued, Normal    aspirin EC 81 MG tablet Take 81 mg by mouth daily., Until Discontinued, Historical Med    atorvastatin (LIPITOR) 80 MG tablet Take 80 mg by mouth daily., Until Discontinued, Historical Med    fexofenadine-pseudoephedrine (ALLEGRA-D 24) 180-240 MG 24 hr tablet Take 1 tablet by mouth daily as needed (for allergies)., Until Discontinued, Historical  Med    fluticasone (FLONASE) 50 MCG/ACT nasal spray Place 2 sprays into both nostrils daily as needed for rhinitis. , Until Discontinued, Historical Med    insulin aspart protamine- aspart (NOVOLOG MIX 70/30) (70-30) 100 UNIT/ML injection Inject 25 Units into  the skin daily., Until Discontinued, Historical Med    metFORMIN (GLUCOPHAGE) 500 MG tablet Take 1,000 mg by mouth 2 (two) times daily with a meal., Until Discontinued, Historical Med    oxyCODONE-acetaminophen (PERCOCET) 5-325 MG tablet Take 1 tablet by mouth every 4 (four) hours as needed for severe pain., Starting 07/23/2015, Until Discontinued, Print    tamsulosin (FLOMAX) 0.4 MG CAPS capsule Take 1 capsule (0.4 mg total) by mouth daily., Starting 07/18/2015, Until Discontinued, Normal      STOP taking these medications     hydrochlorothiazide (HYDRODIURIL) 25 MG tablet      losartan (COZAAR) 100 MG tablet      sulfamethoxazole-trimethoprim (BACTRIM DS,SEPTRA DS) 800-160 MG tablet          DISCHARGE INSTRUCTIONS:    If you experience worsening of your admission symptoms, develop shortness of breath, life threatening emergency, suicidal or homicidal thoughts you must seek medical attention immediately by calling 911 or calling your MD immediately  if symptoms less severe.  You Must read complete instructions/literature along with all the possible adverse reactions/side effects for all the Medicines you take and that have been prescribed to you. Take any new Medicines after you have completely understood and accept all the possible adverse reactions/side effects.   Please note  You were cared for by a hospitalist during your hospital stay. If you have any questions about your discharge medications or the care you received while you were in the hospital after you are discharged, you can call the unit and asked to speak with the hospitalist on call if the hospitalist that took care of you is not available. Once you are discharged, your primary care physician will handle any further medical issues. Please note that NO REFILLS for any discharge medications will be authorized once you are discharged, as it is imperative that you return to your primary care physician (or establish a  relationship with a primary care physician if you do not have one) for your aftercare needs so that they can reassess your need for medications and monitor your lab values.    Today   SUBJECTIVE   Much better left ankle pain and right shoulder pain.   VITAL SIGNS:  Blood pressure 133/80, pulse 70, temperature 98.4 F (36.9 C), temperature source Oral, resp. rate 19, height  (1.854 m), weight 134.038 kg (295 lb 8 oz), SpO2 97 %.  I/O:   Intake/Output Summary (Last 24 hours) at 07/31/15 1824 Last data filed at 07/31/15 0936  Gross per 24 hour  Intake    460 ml  Output    600 ml  Net   -140 ml    PHYSICAL EXAMINATION:  GENERAL:  60 y.o.-year-old patient lying in the bed with no acute distress. Morbidly obese. EYES: Pupils equal, round, reactive to light and accommodation. No scleral icterus. Extraocular muscles intact.  HEENT: Head atraumatic, normocephalic. Oropharynx and nasopharynx clear.  NECK:  Supple, no jugular venous distention. No thyroid enlargement, no tenderness.  LUNGS: Normal breath sounds bilaterally, no wheezing, rales,rhonchi or crepitation. No use of accessory muscles of respiration.  CARDIOVASCULAR: S1, S2 normal. No murmurs, rubs, or gallops.  ABDOMEN: Soft, non-tender, non-distended. Bowel sounds present. No organomegaly or mass.  EXTREMITIES:  No pedal edema, cyanosis, or clubbing.  NEUROLOGIC: Cranial nerves II through XII are intact. Muscle strength 5/5 in all extremities. Sensation intact. Gait not checked.  PSYCHIATRIC: The patient is alert and oriented x 3.  SKIN: No obvious rash, lesion, or ulcer.   DATA REVIEW:   CBC  Recent Labs Lab 07/28/15 0354  WBC 8.5  HGB 12.6*  HCT 39.2*  PLT 180    Chemistries   Recent Labs Lab 07/25/15 1248 07/26/15 0637  07/30/15 0406  NA 124* 131*  < > 134*  K 3.7 3.1*  < > 3.8  CL 90* 101  < > 103  CO2 20* 27  < > 27  GLUCOSE 452* 326*  < > 148*  BUN 40* 35*  < > 20  CREATININE 1.09 1.06  < >  0.48*  CALCIUM 8.1* 7.6*  < > 8.2*  MG  --  2.6*  --   --   AST 13*  --   --   --   ALT 26  --   --   --   ALKPHOS 97  --   --   --   BILITOT 1.3*  --   --   --   < > = values in this interval not displayed.  Cardiac Enzymes  Recent Labs Lab 07/26/15 0637  TROPONINI 0.08*    Microbiology Results  Results for orders placed or performed during the hospital encounter of 07/25/15  Blood Culture (routine x 2)     Status: None   Collection Time: 07/25/15 12:48 PM  Result Value Ref Range Status   Specimen Description BLOOD LEFT HAND  Final   Special Requests   Final    BOTTLES DRAWN AEROBIC AND ANAEROBIC AEROBIC ANAEROBIC   Culture NO GROWTH 6 DAYS  Final   Report Status 07/31/2015 FINAL  Final  Urine culture     Status: None   Collection Time: 07/25/15 12:48 PM  Result Value Ref Range Status   Specimen Description URINE, RANDOM  Final   Special Requests NONE  Final   Culture NO GROWTH  Final   Report Status 07/26/2015 FINAL  Final  Blood Culture (routine x 2)     Status: None   Collection Time: 07/25/15  1:05 PM  Result Value Ref Range Status   Specimen Description BLOOD RIGHT FATTY CASTS  Final   Special Requests   Final    BOTTLES DRAWN AEROBIC AND ANAEROBIC ANA 5CC AERO 10CC   Culture NO GROWTH 6 DAYS  Final   Report Status 07/31/2015 FINAL  Final  MRSA PCR Screening     Status: None   Collection Time: 07/25/15  8:45 PM  Result Value Ref Range Status   MRSA by PCR NEGATIVE NEGATIVE Final    Comment:        The GeneXpert MRSA Assay (FDA approved for NASAL specimens only), is one component of a comprehensive MRSA colonization surveillance program. It is not intended to diagnose MRSA infection nor to guide or monitor treatment for MRSA infections.     RADIOLOGY:  Mr Foot Left Wo Contrast  07/30/2015  CLINICAL DATA:  Anterior left ankle and foot pain and swelling. EXAM: MRI OF THE LEFT HINDFOOT WITHOUT CONTRAST TECHNIQUE: Multiplanar, multisequence MR  imaging was performed. No intravenous contrast was administered. COMPARISON:  Radiographs dated 07/23/2015 FINDINGS: TENDONS Peroneal: Minimal tendinopathy of the peroneus longus tendon at the level of the lateral malleolus. Posteromedial: Normal. Anterior: Normal. Achilles: Hypertrophy and chronic  degenerative changes of the distal Achilles tendon with calcific tendinopathy distally . Plantar Fascia: Negative. LIGAMENTS Lateral: Normal. Medial: Normal. CARTILAGE Ankle Joint: Normal. Subtalar Joints/Sinus Tarsi: Minimal chondromalacia of the posterior facet of the talus. Bones: Focal severe arthritis between the lateral aspect of the navicular and the cuboid and lateral cuneiform with osteophyte formation. Subcortical edema, and a 28 by 13 x 4 mm overlying the ganglion cyst. SOFT TISSUES: Circumferential subcutaneous edema at the ankle extending onto the dorsum of the foot. This is nonspecific. IMPRESSION: 1. 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 2. Chronic distal Achilles tendinopathy. 3. Nonspecific subcutaneous edema. Electronically Signed   By: Francene Boyers M.D.   On: 07/30/2015 11:25   Dg Foot Complete Left  07/30/2015  CLINICAL DATA:  Sudden onset of left foot pain, no known injury EXAM: LEFT FOOT - COMPLETE 3+ VIEW COMPARISON:  07/23/2015 FINDINGS: Three views of the left foot submitted. No acute fracture or subluxation. Degenerative changes are noted with dorsal spurring tarsal region. Mild dorsal spurring anterior aspect of the talus. There is plantar and posterior spurring of calcaneus. Degenerative changes at the articulation of navicular with cuneiform bone. Mild spurring at the base of fifth metatarsal. IMPRESSION: No acute fracture or subluxation. Degenerative changes as described above. There is plantar and posterior spurring of calcaneus. Electronically Signed   By: Natasha Mead M.D.   On: 07/30/2015 15:46         Management plans discussed with the patient, his son and they are in agreement.  CODE STATUS:     Code Status Orders        Start     Ordered   07/25/15 2028  Full code   Continuous     07/25/15 2027    Code Status History    Date Active Date Inactive Code Status Order ID Comments User Context   This patient has a current code status but no historical code status.    Advance Directive Documentation        Most Recent Value   Type of Advance Directive  Living will   Pre-existing out of facility DNR order (yellow form or pink MOST form)     "MOST" Form in Place?        TOTAL TIME TAKING CARE OF THIS PATIENT: 37 minutes.    Shaune Pollack M.D on 07/31/2015 at 6:24 PM  Between 7am to 6pm - Pager - [phone]  After 6pm go to [email or web address] - password [email or web address] [facility]  [facility]  Office  [phone]  CC: Primary care physician; Vonita Moss, MD

## 2015-08-01 LAB — GLUCOSE, CAPILLARY: Glucose-Capillary: 258 mg/dL — ABNORMAL HIGH (ref 65–99)

## 2015-08-01 MED ORDER — COLCHICINE 0.6 MG PO TABS: 0.6000 mg | ORAL_TABLET | Freq: Every day | ORAL | Status: DC

## 2015-08-01 MED ORDER — PREDNISONE 5 MG PO TABS: ORAL_TABLET | ORAL | Status: DC

## 2015-08-07 ENCOUNTER — Encounter: Payer: Self-pay | Admitting: Family Medicine

## 2015-08-07 ENCOUNTER — Ambulatory Visit (INDEPENDENT_AMBULATORY_CARE_PROVIDER_SITE_OTHER): Payer: BLUE CROSS/BLUE SHIELD | Admitting: Family Medicine

## 2015-08-07 VITALS — BP 106/75 | HR 101 | Temp 98.3°F | Wt 275.0 lb

## 2015-08-07 DIAGNOSIS — M1A00X0 Idiopathic chronic gout, unspecified site, without tophus (tophi): Secondary | ICD-10-CM

## 2015-08-07 DIAGNOSIS — I10 Essential (primary) hypertension: Secondary | ICD-10-CM | POA: Diagnosis not present

## 2015-08-07 DIAGNOSIS — Z794 Long term (current) use of insulin: Secondary | ICD-10-CM | POA: Diagnosis not present

## 2015-08-07 DIAGNOSIS — E1165 Type 2 diabetes mellitus with hyperglycemia: Secondary | ICD-10-CM

## 2015-08-07 MED ORDER — OXYCODONE HCL 5 MG PO TABS: 5.0000 mg | ORAL_TABLET | Freq: Four times a day (QID) | ORAL | Status: DC | PRN

## 2015-08-07 MED ORDER — PREDNISONE 10 MG PO TABS: ORAL_TABLET | ORAL | Status: DC

## 2015-08-07 NOTE — Progress Notes (Signed)
BP 106/75 mmHg  Pulse 101  Temp(Src) 98.3 F (36.8 C)  Ht   Wt 275 lb (124.739 kg)  SpO2 92%   Subjective:    Patient ID: Edward Myers, male    DOB: 12-30-1955, 60 y.o.   MRN: [id number]  HPI: Edward Myers is a 60 y.o. male  Chief Complaint  Patient presents with  . 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 Follow up with orthopedics, follow up with podiatry, follow up with rheumatology, follow up with endocrine Status: better   Shoulder is doing better after the shot. Foot is still hurting a whole lot. Seeing the foot doctor 08/14/15. Has home health and PT coming in to see him. Has been trying to lose weight. Has lost about 60lbs  FOOT PAIN Duration: 2 weeks Involved foot: left Mechanism of injury: None Location: In the middle, by his ankle Onset: sudden  Severity: 6/10 - after taking his medicine Quality:  sharp Frequency: constant Radiation: no Aggravating factors: weight bearing and movement  Alleviating factors: elevation  Status: better Treatments attempted: prednisone, colchicine and oxycodone  Weakness with weight bearing or walking: yes Morning stiffness: yes Swelling: yes Redness: yes Bruising: no Paresthesias / decreased sensation: no  Fevers:no  DIABETES-  A1c 15.1. Hasn't been able to get any of the new medicationas be  Relevant past medical, surgical, family and social history reviewed and updated as indicated. Interim medical history since our last visit reviewed. Allergies and medications reviewed and updated.  Review of Systems  Per HPI unless specifically indicated above     Objective:    BP 106/75 mmHg  Pulse 101  Temp(Src) 98.3 F (36.8 C)  Ht   Wt 275 lb (124.739 kg)  SpO2 92%  Wt Readings from Last 3 Encounters:  08/07/15 275 lb (124.739 kg)  07/30/15 295 lb 8 oz (134.038 kg)  07/23/15 300 lb (136.079 kg)    Physical Exam  Constitutional: He is oriented to person, place, and time. He appears well-developed and well-nourished. No distress.  HENT:  Head: Normocephalic and atraumatic.  Right Ear: Hearing normal.  Left Ear: Hearing normal.  Nose: Nose normal.  Eyes: Conjunctivae and lids are normal. Right eye exhibits no discharge. Left eye exhibits no discharge. No scleral icterus.  Cardiovascular: Normal rate, regular rhythm, normal heart sounds and intact distal pulses.  Exam reveals no gallop and no friction rub.   No murmur heard. Pulmonary/Chest: Effort normal and breath sounds normal. No respiratory distress. He has no wheezes. He has no rales. He exhibits no tenderness.  Musculoskeletal: Normal range of motion. He exhibits edema (L foot and ankle).  Neurological: He is alert and oriented to person, place, and time.  Skin: Skin is warm, dry and intact. No rash noted. No erythema. No pallor.  Psychiatric: He has a normal mood and affect. His speech is normal and behavior is normal. Judgment and thought content normal. Cognition and memory  are normal.  Nursing note and vitals reviewed.   Results for orders placed or performed during the hospital encounter of 07/25/15  Blood Culture (routine x 2)  Result Value Ref Range   Specimen Description BLOOD RIGHT FATTY CASTS    Special Requests      BOTTLES DRAWN AEROBIC  AND ANAEROBIC ANA 5CC AERO 10CC   Culture NO GROWTH 6 DAYS    Report Status 07/31/2015 FINAL   Blood Culture (routine x 2)  Result Value Ref Range   Specimen Description BLOOD LEFT HAND    Special Requests      BOTTLES DRAWN AEROBIC AND ANAEROBIC AEROBIC ANAEROBIC   Culture NO GROWTH 6 DAYS    Report Status 07/31/2015 FINAL   Urine culture  Result Value Ref Range   Specimen Description URINE, RANDOM    Special Requests NONE    Culture NO GROWTH    Report Status 07/26/2015 FINAL   MRSA PCR Screening  Result Value Ref Range   MRSA by PCR NEGATIVE NEGATIVE  Lactic acid, plasma  Result Value Ref Range   Lactic Acid, Venous 1.9 0.5 - 2.0 mmol/L  Lactic acid, plasma  Result Value Ref Range   Lactic Acid, Venous 2.3 (HH) 0.5 - 2.0 mmol/L  Comprehensive metabolic panel  Result Value Ref Range   Sodium 124 (L) 135 - 145 mmol/L   Potassium 3.7 3.5 - 5.1 mmol/L   Chloride 90 (L) 101 - 111 mmol/L   CO2 20 (L) 22 - 32 mmol/L   Glucose, Bld 452 (H) 65 - 99 mg/dL   BUN 40 (H) 6 - 20 mg/dL   Creatinine, Ser 1.61 0.61 - 1.24 mg/dL   Calcium 8.1 (L) 8.9 - 10.3 mg/dL   Total Protein 6.4 (L) 6.5 - 8.1 g/dL   Albumin 2.9 (L) 3.5 - 5.0 g/dL   AST 13 (L) 15 - 41 U/L   ALT 26 17 - 63 U/L   Alkaline Phosphatase 97 38 - 126 U/L   Total Bilirubin 1.3 (H) 0.3 - 1.2 mg/dL   GFR calc non Af Amer >60 >60 mL/min   GFR calc Af Amer >60 >60 mL/min   Anion gap 14 5 - 15  Lipase, blood  Result Value Ref Range   Lipase 23 11 - 51 U/L  Troponin I  Result Value Ref Range   Troponin I 0.12 (H) <0.031 ng/mL  CBC WITH DIFFERENTIAL  Result Value Ref Range   WBC 17.1 (H) 3.8 - 10.6 K/uL   RBC 5.09 4.40 - 5.90 MIL/uL   Hemoglobin 13.6 13.0 - 18.0 g/dL   HCT 09.6 04.5 - 40.9 %   MCV 81.6 80.0 - 100.0 fL   MCH 26.8 26.0 - 34.0 pg   MCHC 32.8 32.0 - 36.0 g/dL   RDW 81.1 91.4 - 78.2 %   Platelets 149 (L) 150 - 440 K/uL   Neutrophils Relative % 83 %   Neutro Abs 14.2 (H) 1.4 - 6.5 K/uL    Lymphocytes Relative 6 %   Lymphs Abs 1.0 1.0 - 3.6 K/uL   Monocytes Relative 11 %   Monocytes Absolute 1.8 (H) 0.2 - 1.0 K/uL   Eosinophils Relative 0 %   Eosinophils Absolute 0.0 0 - 0.7 K/uL   Basophils Relative 0 %   Basophils Absolute 0.0 0 - 0.1 K/uL  Blood gas, arterial ([facility], [facility], [facility])  Result Value Ref Range   FIO2 0.21    pH, Arterial 7.47 (H) 7.350 - 7.450  pCO2 arterial 29 (L) 32.0 - 48.0 mmHg   pO2, Arterial 68 (L) 83.0 - 108.0 mmHg   Bicarbonate 21.1 21.0 - 28.0 mEq/L   Acid-base deficit 1.4 0.0 - 2.0 mmol/L   O2 Saturation 94.5 %   Patient temperature 37.0    Collection site RIGHT RADIAL    Sample type ARTERIAL DRAW    Allens test (pass/fail) POSITIVE (A) PASS  APTT  Result Value Ref Range   aPTT 28 24 - 36 seconds  Protime-INR  Result Value Ref Range   Prothrombin Time 16.5 (H) 11.4 - 15.0 seconds   INR 1.32   Urinalysis complete, with microscopic ([facility] only)  Result Value Ref Range   Color, Urine YELLOW (A) YELLOW   APPearance CLEAR (A) CLEAR   Glucose, UA >500 (A) NEGATIVE mg/dL   Bilirubin Urine NEGATIVE NEGATIVE   Ketones, ur 1+ (A) NEGATIVE mg/dL   Specific Gravity, Urine 1.022 1.005 - 1.030   Hgb urine dipstick 1+ (A) NEGATIVE   pH 5.0 5.0 - 8.0   Protein, ur NEGATIVE NEGATIVE mg/dL   Nitrite NEGATIVE NEGATIVE   Leukocytes, UA TRACE (A) NEGATIVE   RBC / HPF 0-5 0 - 5 RBC/hpf   WBC, UA 6-30 0 - 5 WBC/hpf   Bacteria, UA NONE SEEN NONE SEEN   Squamous Epithelial / LPF 0-5 (A) NONE SEEN   Mucous PRESENT    Hyaline Casts, UA PRESENT   Uric acid  Result Value Ref Range   Uric Acid, Serum 9.4 (H) 4.4 - 7.6 mg/dL  Heparin level (unfractionated)  Result Value Ref Range   Heparin Unfractionated <0.10 (L) 0.30 - 0.70 IU/mL  CBC  Result Value Ref Range   WBC 12.3 (H) 3.8 - 10.6 K/uL   RBC 4.62 4.40 - 5.90 MIL/uL   Hemoglobin 12.3 (L) 13.0 - 18.0 g/dL   HCT 16.1 (L) 09.6 - 04.5 %   MCV 80.6 80.0 - 100.0 fL   MCH 26.7 26.0 - 34.0 pg   MCHC 33.1 32.0  - 36.0 g/dL   RDW 40.9 81.1 - 91.4 %   Platelets 129 (L) 150 - 440 K/uL  Basic metabolic panel  Result Value Ref Range   Sodium 131 (L) 135 - 145 mmol/L   Potassium 3.1 (L) 3.5 - 5.1 mmol/L   Chloride 101 101 - 111 mmol/L   CO2 27 22 - 32 mmol/L   Glucose, Bld 326 (H) 65 - 99 mg/dL   BUN 35 (H) 6 - 20 mg/dL   Creatinine, Ser 7.82 0.61 - 1.24 mg/dL   Calcium 7.6 (L) 8.9 - 10.3 mg/dL   GFR calc non Af Amer >60 >60 mL/min   GFR calc Af Amer >60 >60 mL/min   Anion gap 3 (L) 5 - 15  Uric acid  Result Value Ref Range   Uric Acid, Serum 9.8 (H) 4.4 - 7.6 mg/dL  Glucose, capillary  Result Value Ref Range   Glucose-Capillary 387 (H) 65 - 99 mg/dL  Heparin level (unfractionated)  Result Value Ref Range   Heparin Unfractionated 0.51 0.30 - 0.70 IU/mL  Glucose, capillary  Result Value Ref Range   Glucose-Capillary 396 (H) 65 - 99 mg/dL  Glucose, capillary  Result Value Ref Range   Glucose-Capillary 302 (H) 65 - 99 mg/dL  Glucose, capillary  Result Value Ref Range   Glucose-Capillary 325 (H) 65 - 99 mg/dL  Troponin I  Result Value Ref Range   Troponin I 0.08 (H) <0.031 ng/mL  Magnesium  Result  Value Ref Range   Magnesium 2.6 (H) 1.7 - 2.4 mg/dL  Phosphorus  Result Value Ref Range   Phosphorus 2.8 2.5 - 4.6 mg/dL  Glucose, capillary  Result Value Ref Range   Glucose-Capillary 325 (H) 65 - 99 mg/dL  Glucose, capillary  Result Value Ref Range   Glucose-Capillary 380 (H) 65 - 99 mg/dL  Glucose, capillary  Result Value Ref Range   Glucose-Capillary 348 (H) 65 - 99 mg/dL  Basic metabolic panel  Result Value Ref Range   Sodium 133 (L) 135 - 145 mmol/L   Potassium 3.5 3.5 - 5.1 mmol/L   Chloride 100 (L) 101 - 111 mmol/L   CO2 25 22 - 32 mmol/L   Glucose, Bld 150 (H) 65 - 99 mg/dL   BUN 25 (H) 6 - 20 mg/dL   Creatinine, Ser 2.53 0.61 - 1.24 mg/dL   Calcium 7.8 (L) 8.9 - 10.3 mg/dL   GFR calc non Af Amer >60 >60 mL/min   GFR calc Af Amer >60 >60 mL/min   Anion gap 8 5 - 15   Glucose, capillary  Result Value Ref Range   Glucose-Capillary 356 (H) 65 - 99 mg/dL  Glucose, capillary  Result Value Ref Range   Glucose-Capillary 371 (H) 65 - 99 mg/dL  Glucose, capillary  Result Value Ref Range   Glucose-Capillary 291 (H) 65 - 99 mg/dL  Glucose, capillary  Result Value Ref Range   Glucose-Capillary 298 (H) 65 - 99 mg/dL  Glucose, capillary  Result Value Ref Range   Glucose-Capillary 241 (H) 65 - 99 mg/dL  Glucose, capillary  Result Value Ref Range   Glucose-Capillary 216 (H) 65 - 99 mg/dL  Glucose, capillary  Result Value Ref Range   Glucose-Capillary 217 (H) 65 - 99 mg/dL  Glucose, capillary  Result Value Ref Range   Glucose-Capillary 173 (H) 65 - 99 mg/dL  Glucose, capillary  Result Value Ref Range   Glucose-Capillary 140 (H) 65 - 99 mg/dL  Glucose, capillary  Result Value Ref Range   Glucose-Capillary 134 (H) 65 - 99 mg/dL  Glucose, capillary  Result Value Ref Range   Glucose-Capillary 140 (H) 65 - 99 mg/dL  Glucose, capillary  Result Value Ref Range   Glucose-Capillary 140 (H) 65 - 99 mg/dL  Glucose, capillary  Result Value Ref Range   Glucose-Capillary 125 (H) 65 - 99 mg/dL  Glucose, capillary  Result Value Ref Range   Glucose-Capillary 141 (H) 65 - 99 mg/dL  Glucose, capillary  Result Value Ref Range   Glucose-Capillary 240 (H) 65 - 99 mg/dL  Glucose, capillary  Result Value Ref Range   Glucose-Capillary 265 (H) 65 - 99 mg/dL  CBC  Result Value Ref Range   WBC 8.5 3.8 - 10.6 K/uL   RBC 4.83 4.40 - 5.90 MIL/uL   Hemoglobin 12.6 (L) 13.0 - 18.0 g/dL   HCT 66.4 (L) 40.3 - 47.4 %   MCV 81.1 80.0 - 100.0 fL   MCH 26.2 26.0 - 34.0 pg   MCHC 32.3 32.0 - 36.0 g/dL   RDW 25.9 56.3 - 87.5 %   Platelets 180 150 - 440 K/uL  Basic metabolic panel  Result Value Ref Range   Sodium 132 (L) 135 - 145 mmol/L   Potassium 4.5 3.5 - 5.1 mmol/L   Chloride 100 (L) 101 - 111 mmol/L   CO2 24 22 - 32 mmol/L   Glucose, Bld 318 (H) 65 - 99 mg/dL    BUN 27 (H) 6 -  20 mg/dL   Creatinine, Ser 8.11 0.61 - 1.24 mg/dL   Calcium 8.3 (L) 8.9 - 10.3 mg/dL   GFR calc non Af Amer >60 >60 mL/min   GFR calc Af Amer >60 >60 mL/min   Anion gap 8 5 - 15  Glucose, capillary  Result Value Ref Range   Glucose-Capillary 323 (H) 65 - 99 mg/dL   Comment 1 Notify RN   Glucose, capillary  Result Value Ref Range   Glucose-Capillary 334 (H) 65 - 99 mg/dL  Glucose, capillary  Result Value Ref Range   Glucose-Capillary 347 (H) 65 - 99 mg/dL   Comment 1 Notify RN   Glucose, capillary  Result Value Ref Range   Glucose-Capillary 362 (H) 65 - 99 mg/dL   Comment 1 Notify RN   Glucose, capillary  Result Value Ref Range   Glucose-Capillary 315 (H) 65 - 99 mg/dL   Comment 1 Notify RN   Glucose, capillary  Result Value Ref Range   Glucose-Capillary 392 (H) 65 - 99 mg/dL   Comment 1 Notify RN   Glucose, capillary  Result Value Ref Range   Glucose-Capillary 288 (H) 65 - 99 mg/dL   Comment 1 Notify RN   Glucose, capillary  Result Value Ref Range   Glucose-Capillary 356 (H) 65 - 99 mg/dL   Comment 1 Notify RN   Basic metabolic panel  Result Value Ref Range   Sodium 134 (L) 135 - 145 mmol/L   Potassium 3.8 3.5 - 5.1 mmol/L   Chloride 103 101 - 111 mmol/L   CO2 27 22 - 32 mmol/L   Glucose, Bld 148 (H) 65 - 99 mg/dL   BUN 20 6 - 20 mg/dL   Creatinine, Ser 9.14 (L) 0.61 - 1.24 mg/dL   Calcium 8.2 (L) 8.9 - 10.3 mg/dL   GFR calc non Af Amer >60 >60 mL/min   GFR calc Af Amer >60 >60 mL/min   Anion gap 4 (L) 5 - 15  Glucose, capillary  Result Value Ref Range   Glucose-Capillary 222 (H) 65 - 99 mg/dL   Comment 1 Notify RN   Glucose, capillary  Result Value Ref Range   Glucose-Capillary 151 (H) 65 - 99 mg/dL   Comment 1 Notify RN   Glucose, capillary  Result Value Ref Range   Glucose-Capillary 181 (H) 65 - 99 mg/dL   Comment 1 Notify RN   Glucose, capillary  Result Value Ref Range   Glucose-Capillary 255 (H) 65 - 99 mg/dL   Comment 1 Notify RN    Glucose, capillary  Result Value Ref Range   Glucose-Capillary 303 (H) 65 - 99 mg/dL  Glucose, capillary  Result Value Ref Range   Glucose-Capillary 146 (H) 65 - 99 mg/dL  Hemoglobin N8G  Result Value Ref Range   Hgb A1c MFr Bld 15.6 (H) 4.0 - 6.0 %  Glucose, capillary  Result Value Ref Range   Glucose-Capillary 235 (H) 65 - 99 mg/dL  Glucose, capillary  Result Value Ref Range   Glucose-Capillary 258 (H) 65 - 99 mg/dL   Comment 1 Notify RN       Assessment & Plan:   Problem List Items Addressed This Visit      Cardiovascular and Mediastinum   Hypertension    Still quite low. Will monitor closely to make sure that his BP is not going back up off medication as he improves        Endocrine   Diabetes mellitus with hyperglycemia (HCC)  Under poor control. To see endocrinology, needs to make an appointment. Will watch sugars very carefully with the increased prednisone we are giving him.         Musculoskeletal and Integument   Chronic gouty arthropathy - Primary    Continues with quite a bit of foot pain. Was doing better with his higher dose of prednisone. Will do prednisone taper until he gets in to see podiatry. Short course of oxycodone given for pain. Continue to monitor closely.           Follow up plan: Return 1-2 months, for Follow up with [facility].

## 2015-08-08 NOTE — Assessment & Plan Note (Signed)
Under poor control. To see endocrinology, needs to make an appointment. Will watch sugars very carefully with the increased prednisone we are giving him.

## 2015-08-08 NOTE — Assessment & Plan Note (Signed)
Continues with quite a bit of foot pain. Was doing better with his higher dose of prednisone. Will do prednisone taper until he gets in to see podiatry. Short course of oxycodone given for pain. Continue to monitor closely.

## 2015-08-08 NOTE — Assessment & Plan Note (Signed)
Still quite low. Will monitor closely to make sure that his BP is not going back up off medication as he improves

## 2015-08-14 ENCOUNTER — Other Ambulatory Visit: Payer: Self-pay | Admitting: Podiatry

## 2015-08-14 DIAGNOSIS — M79672 Pain in left foot: Principal | ICD-10-CM

## 2015-08-14 DIAGNOSIS — G8929 Other chronic pain: Secondary | ICD-10-CM

## 2015-08-16 ENCOUNTER — Telehealth: Payer: Self-pay | Admitting: Family Medicine

## 2015-08-16 ENCOUNTER — Ambulatory Visit: Payer: BLUE CROSS/BLUE SHIELD | Attending: Podiatry

## 2015-08-16 NOTE — Telephone Encounter (Signed)
Phone call Discussed with patient having symptoms of urinary retention and urinary tract infection. Cautioned patient is important for him to go to the emergency room now.

## 2015-08-16 NOTE — Telephone Encounter (Signed)
Pt called in and stated that he isn't really urinating the way he should. He stated that it's painful when he goes and when he does go its not that much yet he still feels like he has more urine that could potentially come out. He would like a call back at either the home number provided or his cell  [phone].

## 2015-08-21 ENCOUNTER — Telehealth: Payer: Self-pay

## 2015-08-21 NOTE — Telephone Encounter (Signed)
Requesting order for skilled nursing visits 2 x week X 4weeks

## 2015-08-22 NOTE — Telephone Encounter (Signed)
Clarification request is for Skilled Nursing every other week for next four weeks  Ok'd per [facility]  Verbal called in

## 2015-08-24 ENCOUNTER — Other Ambulatory Visit: Payer: Self-pay | Admitting: Family Medicine

## 2015-08-27 ENCOUNTER — Other Ambulatory Visit: Payer: Self-pay

## 2015-08-27 MED ORDER — INSULIN ASPART PROT & ASPART (70-30 MIX) 100 UNIT/ML ~~LOC~~ SUSP: 25.0000 [IU] | Freq: Every day | SUBCUTANEOUS | Status: DC

## 2015-08-30 ENCOUNTER — Ambulatory Visit
Admission: RE | Admit: 2015-08-30 | Discharge: 2015-08-30 | Disposition: A | Discharge status: Home / Self Care | Payer: BLUE CROSS/BLUE SHIELD | Source: Ambulatory Visit | Attending: Podiatry | Admitting: Podiatry

## 2015-08-30 DIAGNOSIS — G8929 Other chronic pain: Secondary | ICD-10-CM | POA: Diagnosis present

## 2015-08-30 DIAGNOSIS — M79672 Pain in left foot: Secondary | ICD-10-CM

## 2015-08-30 DIAGNOSIS — M85872 Other specified disorders of bone density and structure, left ankle and foot: Secondary | ICD-10-CM | POA: Diagnosis not present

## 2015-08-30 DIAGNOSIS — R600 Localized edema: Secondary | ICD-10-CM | POA: Insufficient documentation

## 2015-09-11 ENCOUNTER — Ambulatory Visit: Payer: BLUE CROSS/BLUE SHIELD | Admitting: Family Medicine

## 2015-11-27 ENCOUNTER — Other Ambulatory Visit: Payer: Self-pay | Admitting: Family Medicine

## 2015-11-27 NOTE — Telephone Encounter (Signed)
Apt with anyone 

## 2015-11-28 ENCOUNTER — Encounter: Payer: Self-pay | Admitting: Family Medicine

## 2015-11-28 NOTE — Telephone Encounter (Signed)
Letter sent.

## 2015-12-01 ENCOUNTER — Emergency Department: Payer: BLUE CROSS/BLUE SHIELD

## 2015-12-01 ENCOUNTER — Inpatient Hospital Stay
Admit: 2015-12-01 | Discharge: 2015-12-01 | Disposition: A | Discharge status: Home / Self Care | Payer: BLUE CROSS/BLUE SHIELD | Attending: Internal Medicine | Admitting: Internal Medicine

## 2015-12-01 ENCOUNTER — Inpatient Hospital Stay
Admission: EM | Admit: 2015-12-01 | Discharge: 2015-12-03 | DRG: 291 | Disposition: A | Discharge status: Home / Self Care | Payer: BLUE CROSS/BLUE SHIELD | Attending: Internal Medicine | Admitting: Internal Medicine

## 2015-12-01 ENCOUNTER — Encounter: Payer: Self-pay | Admitting: Internal Medicine

## 2015-12-01 DIAGNOSIS — N401 Enlarged prostate with lower urinary tract symptoms: Secondary | ICD-10-CM | POA: Diagnosis present

## 2015-12-01 DIAGNOSIS — Z9889 Other specified postprocedural states: Secondary | ICD-10-CM

## 2015-12-01 DIAGNOSIS — H409 Unspecified glaucoma: Secondary | ICD-10-CM | POA: Diagnosis present

## 2015-12-01 DIAGNOSIS — N359 Urethral stricture, unspecified: Secondary | ICD-10-CM | POA: Diagnosis present

## 2015-12-01 DIAGNOSIS — Z7982 Long term (current) use of aspirin: Secondary | ICD-10-CM

## 2015-12-01 DIAGNOSIS — Z79899 Other long term (current) drug therapy: Secondary | ICD-10-CM

## 2015-12-01 DIAGNOSIS — M1000 Idiopathic gout, unspecified site: Secondary | ICD-10-CM | POA: Diagnosis present

## 2015-12-01 DIAGNOSIS — Z87891 Personal history of nicotine dependence: Secondary | ICD-10-CM

## 2015-12-01 DIAGNOSIS — R0602 Shortness of breath: Secondary | ICD-10-CM | POA: Diagnosis not present

## 2015-12-01 DIAGNOSIS — I5021 Acute systolic (congestive) heart failure: Secondary | ICD-10-CM | POA: Diagnosis present

## 2015-12-01 DIAGNOSIS — R338 Other retention of urine: Secondary | ICD-10-CM | POA: Diagnosis present

## 2015-12-01 DIAGNOSIS — N390 Urinary tract infection, site not specified: Secondary | ICD-10-CM | POA: Diagnosis present

## 2015-12-01 DIAGNOSIS — Z833 Family history of diabetes mellitus: Secondary | ICD-10-CM | POA: Diagnosis not present

## 2015-12-01 DIAGNOSIS — I429 Cardiomyopathy, unspecified: Secondary | ICD-10-CM | POA: Diagnosis present

## 2015-12-01 DIAGNOSIS — E785 Hyperlipidemia, unspecified: Secondary | ICD-10-CM | POA: Diagnosis present

## 2015-12-01 DIAGNOSIS — E669 Obesity, unspecified: Secondary | ICD-10-CM | POA: Diagnosis present

## 2015-12-01 DIAGNOSIS — R0902 Hypoxemia: Secondary | ICD-10-CM

## 2015-12-01 DIAGNOSIS — J189 Pneumonia, unspecified organism: Secondary | ICD-10-CM | POA: Diagnosis present

## 2015-12-01 DIAGNOSIS — Z8249 Family history of ischemic heart disease and other diseases of the circulatory system: Secondary | ICD-10-CM

## 2015-12-01 DIAGNOSIS — E1165 Type 2 diabetes mellitus with hyperglycemia: Secondary | ICD-10-CM | POA: Diagnosis present

## 2015-12-01 DIAGNOSIS — I110 Hypertensive heart disease with heart failure: Secondary | ICD-10-CM | POA: Diagnosis not present

## 2015-12-01 DIAGNOSIS — E876 Hypokalemia: Secondary | ICD-10-CM | POA: Diagnosis present

## 2015-12-01 DIAGNOSIS — J9601 Acute respiratory failure with hypoxia: Secondary | ICD-10-CM | POA: Diagnosis present

## 2015-12-01 DIAGNOSIS — I509 Heart failure, unspecified: Secondary | ICD-10-CM

## 2015-12-01 HISTORY — DX: Unspecified urethral stricture, male, unspecified site: N35.919

## 2015-12-01 LAB — CBC WITH DIFFERENTIAL/PLATELET
BASOS ABS: 0 10*3/uL (ref 0–0.1)
BASOS PCT: 1 %
EOS ABS: 0.1 10*3/uL (ref 0–0.7)
EOS PCT: 2 %
HCT: 40.9 % (ref 40.0–52.0)
Hemoglobin: 13.4 g/dL (ref 13.0–18.0)
LYMPHS PCT: 31 %
Lymphs Abs: 2 10*3/uL (ref 1.0–3.6)
MCH: 28.1 pg (ref 26.0–34.0)
MCHC: 32.7 g/dL (ref 32.0–36.0)
MCV: 85.9 fL (ref 80.0–100.0)
MONO ABS: 0.5 10*3/uL (ref 0.2–1.0)
Monocytes Relative: 8 %
Neutro Abs: 3.8 10*3/uL (ref 1.4–6.5)
Neutrophils Relative %: 58 %
PLATELETS: 170 10*3/uL (ref 150–440)
RBC: 4.76 MIL/uL (ref 4.40–5.90)
RDW: 14.1 % (ref 11.5–14.5)
WBC: 6.4 10*3/uL (ref 3.8–10.6)

## 2015-12-01 LAB — GLUCOSE, CAPILLARY
Glucose-Capillary: 95 mg/dL (ref 65–99)
Glucose-Capillary: 99 mg/dL (ref 65–99)
Glucose-Capillary: 115 mg/dL — ABNORMAL HIGH (ref 65–99)
Glucose-Capillary: 92 mg/dL (ref 65–99)

## 2015-12-01 LAB — COMPREHENSIVE METABOLIC PANEL
ALBUMIN: 3.9 g/dL (ref 3.5–5.0)
ALT: 26 U/L (ref 17–63)
AST: 26 U/L (ref 15–41)
Alkaline Phosphatase: 76 U/L (ref 38–126)
Anion gap: 5 (ref 5–15)
BILIRUBIN TOTAL: 0.6 mg/dL (ref 0.3–1.2)
BUN: 17 mg/dL (ref 6–20)
CO2: 24 mmol/L (ref 22–32)
Calcium: 9 mg/dL (ref 8.9–10.3)
Chloride: 111 mmol/L (ref 101–111)
Creatinine, Ser: 0.74 mg/dL (ref 0.61–1.24)
GFR calc Af Amer: >60 mL/min (ref >60)
GFR calc non Af Amer: >60 mL/min (ref >60)
GLUCOSE: 113 mg/dL — AB (ref 65–99)
POTASSIUM: 3.6 mmol/L (ref 3.5–5.1)
SODIUM: 140 mmol/L (ref 135–145)
Total Protein: 6.7 g/dL (ref 6.5–8.1)

## 2015-12-01 LAB — BRAIN NATRIURETIC PEPTIDE: B Natriuretic Peptide: 369 pg/mL — ABNORMAL HIGH (ref 0.0–100.0)

## 2015-12-01 LAB — ECHOCARDIOGRAM COMPLETE
Height: 72 in
Weight: 4640 oz

## 2015-12-01 LAB — HEMOGLOBIN A1C: Hgb A1c MFr Bld: 5.5 % (ref 4.0–6.0)

## 2015-12-01 LAB — TROPONIN I: Troponin I: 0.03 ng/mL (ref <0.031)

## 2015-12-01 LAB — TSH: TSH: 4.563 u[IU]/mL — ABNORMAL HIGH (ref 0.350–4.500)

## 2015-12-01 MED ORDER — FUROSEMIDE 10 MG/ML IJ SOLN
40.0000 mg | Freq: Two times a day (BID) | INTRAMUSCULAR | Status: DC
  Administered 2015-12-01 – 2015-12-03 (×4): 40 mg via INTRAVENOUS
  Filled 2015-12-01 (×4): qty 4

## 2015-12-01 MED ORDER — FLUTICASONE PROPIONATE 50 MCG/ACT NA SUSP
2.0000 | Freq: Every day | NASAL | Status: DC | PRN
Start: 2015-12-01 — End: 2015-12-03
  Filled 2015-12-01: qty 16

## 2015-12-01 MED ORDER — FUROSEMIDE 10 MG/ML IJ SOLN
20.0000 mg | Freq: Once | INTRAMUSCULAR | Status: AC
  Administered 2015-12-01: 20 mg via INTRAVENOUS
  Filled 2015-12-01: qty 4

## 2015-12-01 MED ORDER — INSULIN GLARGINE 100 UNIT/ML ~~LOC~~ SOLN
14.0000 [IU] | Freq: Every day | SUBCUTANEOUS | Status: DC
  Administered 2015-12-01 – 2015-12-02 (×2): 14 [IU] via SUBCUTANEOUS
  Filled 2015-12-01 (×3): qty 0.14

## 2015-12-01 MED ORDER — OXYCODONE HCL 5 MG PO TABS: 5.0000 mg | ORAL_TABLET | Freq: Four times a day (QID) | ORAL | Status: DC | PRN

## 2015-12-01 MED ORDER — ATORVASTATIN CALCIUM 20 MG PO TABS
80.0000 mg | ORAL_TABLET | Freq: Every day | ORAL | Status: DC
  Administered 2015-12-01 – 2015-12-03 (×3): 80 mg via ORAL
  Filled 2015-12-01 (×3): qty 4

## 2015-12-01 MED ORDER — LEVOFLOXACIN IN D5W 750 MG/150ML IV SOLN
750.0000 mg | INTRAVENOUS | Status: DC
  Administered 2015-12-01 – 2015-12-03 (×3): 750 mg via INTRAVENOUS
  Filled 2015-12-01 (×3): qty 150

## 2015-12-01 MED ORDER — LISINOPRIL 5 MG PO TABS
2.5000 mg | ORAL_TABLET | Freq: Every day | ORAL | Status: DC
  Administered 2015-12-01: 2.5 mg via ORAL
  Filled 2015-12-01: qty 1

## 2015-12-01 MED ORDER — ACETAMINOPHEN 325 MG PO TABS: 650.0000 mg | ORAL_TABLET | Freq: Four times a day (QID) | ORAL | Status: DC | PRN

## 2015-12-01 MED ORDER — CARVEDILOL 6.25 MG PO TABS
6.2500 mg | ORAL_TABLET | Freq: Two times a day (BID) | ORAL | Status: DC
  Administered 2015-12-01: 6.25 mg via ORAL
  Filled 2015-12-01: qty 1

## 2015-12-01 MED ORDER — ONDANSETRON HCL 4 MG PO TABS: 4.0000 mg | ORAL_TABLET | Freq: Four times a day (QID) | ORAL | Status: DC | PRN

## 2015-12-01 MED ORDER — LISINOPRIL 5 MG PO TABS
5.0000 mg | ORAL_TABLET | Freq: Two times a day (BID) | ORAL | Status: DC
  Administered 2015-12-01 – 2015-12-03 (×5): 5 mg via ORAL
  Filled 2015-12-01 (×5): qty 1

## 2015-12-01 MED ORDER — TAMSULOSIN HCL 0.4 MG PO CAPS
0.4000 mg | ORAL_CAPSULE | Freq: Every day | ORAL | Status: DC
  Administered 2015-12-01 – 2015-12-03 (×3): 0.4 mg via ORAL
  Filled 2015-12-01 (×3): qty 1

## 2015-12-01 MED ORDER — METOPROLOL TARTRATE 25 MG PO TABS: 25.0000 mg | ORAL_TABLET | Freq: Two times a day (BID) | ORAL | Status: DC

## 2015-12-01 MED ORDER — ONDANSETRON HCL 4 MG/2ML IJ SOLN: 4.0000 mg | Freq: Four times a day (QID) | INTRAMUSCULAR | Status: DC | PRN

## 2015-12-01 MED ORDER — CARVEDILOL 12.5 MG PO TABS
12.5000 mg | ORAL_TABLET | Freq: Two times a day (BID) | ORAL | Status: DC
  Administered 2015-12-01 – 2015-12-03 (×4): 12.5 mg via ORAL
  Filled 2015-12-01 (×4): qty 1

## 2015-12-01 MED ORDER — ASPIRIN EC 81 MG PO TBEC
81.0000 mg | DELAYED_RELEASE_TABLET | Freq: Every day | ORAL | Status: DC
  Administered 2015-12-01 – 2015-12-03 (×3): 81 mg via ORAL
  Filled 2015-12-01 (×3): qty 1

## 2015-12-01 MED ORDER — ACETAMINOPHEN 650 MG RE SUPP: 650.0000 mg | Freq: Four times a day (QID) | RECTAL | Status: DC | PRN

## 2015-12-01 MED ORDER — SODIUM CHLORIDE 0.9% FLUSH
3.0000 mL | Freq: Two times a day (BID) | INTRAVENOUS | Status: DC
  Administered 2015-12-01 – 2015-12-02 (×4): 3 mL via INTRAVENOUS

## 2015-12-01 MED ORDER — DOCUSATE SODIUM 100 MG PO CAPS
100.0000 mg | ORAL_CAPSULE | Freq: Two times a day (BID) | ORAL | Status: DC
  Administered 2015-12-01 – 2015-12-03 (×3): 100 mg via ORAL
  Filled 2015-12-01 (×5): qty 1

## 2015-12-01 MED ORDER — ALLOPURINOL 100 MG PO TABS
100.0000 mg | ORAL_TABLET | Freq: Every day | ORAL | Status: DC
  Administered 2015-12-01 – 2015-12-03 (×3): 100 mg via ORAL
  Filled 2015-12-01 (×3): qty 1

## 2015-12-01 MED ORDER — FUROSEMIDE 20 MG PO TABS
20.0000 mg | ORAL_TABLET | Freq: Every day | ORAL | Status: DC
  Administered 2015-12-01: 20 mg via ORAL
  Filled 2015-12-01: qty 1

## 2015-12-01 MED ORDER — ENOXAPARIN SODIUM 40 MG/0.4ML ~~LOC~~ SOLN
40.0000 mg | SUBCUTANEOUS | Status: DC
  Administered 2015-12-01 – 2015-12-03 (×3): 40 mg via SUBCUTANEOUS
  Filled 2015-12-01 (×3): qty 0.4

## 2015-12-01 MED ORDER — LORATADINE 10 MG PO TABS
10.0000 mg | ORAL_TABLET | Freq: Every day | ORAL | Status: DC
  Administered 2015-12-01 – 2015-12-03 (×3): 10 mg via ORAL
  Filled 2015-12-01 (×3): qty 1

## 2015-12-01 MED ORDER — INSULIN ASPART 100 UNIT/ML ~~LOC~~ SOLN: 0.0000 [IU] | Freq: Three times a day (TID) | SUBCUTANEOUS | Status: DC

## 2015-12-01 NOTE — H&P (Addendum)
Edward Myers is an 60 y.o. male.   Chief Complaint: Shortness of breath HPI: The patient with past medical history significant for diabetes and hypertension presents emergency department complaining of shortness of breath. The patient admits to dyspnea on exertion for 3-4 days as well orthopnea. He has been sleeping in a recliner the last 2 days. He denies cough or frothy sputum production. He denies chest pain, nausea, vomiting or diarrhea. In the emergency department patient was found to have significant rales on physical exam as well as new onset lower extremity edema. Chest x-ray showed pulmonary edema in the emergency department staff ordered Lasix. This improved the patient's work of breathing is well as overall comfort. However oxygen saturations improved from 81% on room air to 96% on 4 L of oxygen via nasal cannula which prompted the emergency department staff to call the hospitalist service for management.  Past Medical History  Diagnosis Date  . Chronic gouty arthropathy   . Glaucoma   . Hyperlipidemia   . Hypertension   . Rhinitis medicamentosa   . Diabetes mellitus without complication ([place])   . Morbid obesity ([place])   . UTI (urinary tract infection)   . Urethral stricture     scheduled for repair May 2017    Past Surgical History  Procedure Laterality Date  . Tonsillectomy    . Circumcision      Family History  Problem Relation Age of Onset  . Heart attack Mother   . Diabetes Mother   . Congestive Heart Failure Mother    Social History:  reports that he quit smoking about 5 years ago. His smoking use included Cigars. He does not have any smokeless tobacco history on file. He reports that he drinks alcohol. His drug history is not on file.  Allergies: No Known Allergies  Prior to Admission medications   Medication Sig Start Date End Date Taking? Authorizing Provider  allopurinol (ZYLOPRIM) 100 MG tablet Take 100 mg by mouth daily.    Yes Historical Provider, MD   aspirin EC 81 MG tablet Take 81 mg by mouth daily.   Yes Historical Provider, MD  atorvastatin (LIPITOR) 80 MG tablet TAKE 1 BY MOUTH DAILY 11/27/15  Yes Guadalupe Maple, MD  fexofenadine-pseudoephedrine (ALLEGRA-D 24) 180-240 MG 24 hr tablet Take 1 tablet by mouth daily as needed (for allergies).   Yes Historical Provider, MD  fluticasone (FLONASE) 50 MCG/ACT nasal spray Place 2 sprays into both nostrils daily as needed for rhinitis.    Yes Historical Provider, MD  insulin aspart protamine- aspart (NOVOLOG MIX 70/30) (70-30) 100 UNIT/ML injection Inject 0.25 mLs (25 Units total) into the skin daily. Patient taking differently: Inject 25 Units into the skin every morning.  08/27/15  Yes Guadalupe Maple, MD  lisinopril (PRINIVIL,ZESTRIL) 2.5 MG tablet Take 2.5 mg by mouth daily.   Yes Historical Provider, MD  oxyCODONE (ROXICODONE) 5 MG immediate release tablet Take 1 tablet (5 mg total) by mouth every 6 (six) hours as needed for severe pain. 08/07/15  Yes Megan P Johnson, DO  tamsulosin (FLOMAX) 0.4 MG CAPS capsule TAKE 1 BY MOUTH DAILY 08/27/15  Yes Guadalupe Maple, MD  amLODipine (NORVASC) 10 MG tablet Take 1 tablet (10 mg total) by mouth daily. Patient not taking: Reported on 08/07/2015 01/29/15   Guadalupe Maple, MD  colchicine 0.6 MG tablet Take 1 tablet (0.6 mg total) by mouth daily. Patient not taking: Reported on 12/01/2015 08/01/15   Demetrios Loll, MD  predniSONE (  DELTASONE) 10 MG tablet 6 tabs today, 5 tabs tomorrow, decrease by 1 each day until gone Patient not taking: Reported on 12/01/2015 08/07/15   Valerie Roys, DO     Results for orders placed or performed during the hospital encounter of 12/01/15 (from the past 48 hour(s))  CBC with Differential     Status: None   Collection Time: 12/01/15  4:20 AM  Result Value Ref Range   WBC 6.4 3.8 - 10.6 K/uL   RBC 4.76 4.40 - 5.90 MIL/uL   Hemoglobin 13.4 13.0 - 18.0 g/dL   HCT 40.9 40.0 - 52.0 %   MCV 85.9 80.0 - 100.0 fL   MCH 28.1 26.0 -  34.0 pg   MCHC 32.7 32.0 - 36.0 g/dL   RDW 14.1 11.5 - 14.5 %   Platelets 170 150 - 440 K/uL   Neutrophils Relative % 58 %   Neutro Abs 3.8 1.4 - 6.5 K/uL   Lymphocytes Relative 31 %   Lymphs Abs 2.0 1.0 - 3.6 K/uL   Monocytes Relative 8 %   Monocytes Absolute 0.5 0.2 - 1.0 K/uL   Eosinophils Relative 2 %   Eosinophils Absolute 0.1 0 - 0.7 K/uL   Basophils Relative 1 %   Basophils Absolute 0.0 0 - 0.1 K/uL  Comprehensive metabolic panel     Status: Abnormal   Collection Time: 12/01/15  4:20 AM  Result Value Ref Range   Sodium 140 135 - 145 mmol/L   Potassium 3.6 3.5 - 5.1 mmol/L   Chloride 111 101 - 111 mmol/L   CO2 24 22 - 32 mmol/L   Glucose, Bld 113 (H) 65 - 99 mg/dL   BUN 17 6 - 20 mg/dL   Creatinine, Ser 0.74 0.61 - 1.24 mg/dL   Calcium 9.0 8.9 - 10.3 mg/dL   Total Protein 6.7 6.5 - 8.1 g/dL   Albumin 3.9 3.5 - 5.0 g/dL   AST 26 15 - 41 U/L   ALT 26 17 - 63 U/L   Alkaline Phosphatase 76 38 - 126 U/L   Total Bilirubin 0.6 0.3 - 1.2 mg/dL   GFR calc non Af Amer >60 >60 mL/min   GFR calc Af Amer >60 >60 mL/min    Comment: (NOTE) The eGFR has been calculated using the CKD EPI equation. This calculation has not been validated in all clinical situations. eGFR's persistently <60 mL/min signify possible Chronic Kidney Disease.    Anion gap 5 5 - 15  Brain natriuretic peptide     Status: Abnormal   Collection Time: 12/01/15  4:20 AM  Result Value Ref Range   B Natriuretic Peptide 369.0 (H) 0.0 - 100.0 pg/mL  Troponin I     Status: None   Collection Time: 12/01/15  4:20 AM  Result Value Ref Range   Troponin I 0.03 <0.031 ng/mL    Comment:        NO INDICATION OF MYOCARDIAL INJURY.    Dg Chest Port 1 View  12/01/2015  CLINICAL DATA:  Shortness of breath for 2 weeks. Bilateral crackles. Edema in the lower legs. EXAM: PORTABLE CHEST 1 VIEW COMPARISON:  07/25/2015 FINDINGS: Cardiac enlargement. Focal airspace disease in the right lung base. This is likely to represent  pneumonia. No blunting of costophrenic angles. No pneumothorax. Tortuous aorta. IMPRESSION: Cardiac enlargement. Focal airspace disease in the right lung base likely representing pneumonia. Electronically Signed   By: Lucienne Capers M.D.   On: 12/01/2015 06:15    Review of  Systems  Constitutional: Negative for fever and chills.  HENT: Negative for sore throat and tinnitus.   Eyes: Negative for blurred vision and redness.  Respiratory: Positive for shortness of breath. Negative for cough and sputum production.   Cardiovascular: Positive for orthopnea and leg swelling. Negative for chest pain, palpitations and PND.  Gastrointestinal: Negative for nausea, vomiting, abdominal pain and diarrhea.  Genitourinary: Negative for dysuria, urgency and frequency.  Musculoskeletal: Negative for myalgias and joint pain.  Skin: Negative for rash.       No lesions  Neurological: Negative for speech change, focal weakness and weakness.  Endo/Heme/Allergies: Does not bruise/bleed easily.       No temperature intolerance  Psychiatric/Behavioral: Negative for depression and suicidal ideas.    Blood pressure 139/100, pulse 95, resp. rate 21, height 6' (1.829 m), weight 124.286 kg (274 lb), SpO2 96 %. Physical Exam  Vitals reviewed. Constitutional: He is oriented to person, place, and time. He appears well-developed and well-nourished. No distress.  HENT:  Head: Normocephalic and atraumatic.  Mouth/Throat: Oropharynx is clear and moist.  Eyes: Conjunctivae and EOM are normal. Pupils are equal, round, and reactive to light. No scleral icterus.  Neck: Normal range of motion. Neck supple. No JVD present. No tracheal deviation present. No thyromegaly present.  Cardiovascular: Regular rhythm and normal heart sounds.  Tachycardia present.  Exam reveals no gallop and no friction rub.   No murmur heard. Respiratory: Effort normal and breath sounds normal. No respiratory distress. He has no wheezes.  GI: Soft.  Bowel sounds are normal. He exhibits no distension. There is no tenderness.  Genitourinary:  Deferred  Musculoskeletal: Normal range of motion. He exhibits edema.  Lymphadenopathy:    He has no cervical adenopathy.  Neurological: He is alert and oriented to person, place, and time. No cranial nerve deficit.  Skin: Skin is warm and dry. No rash noted. No erythema.  Psychiatric: He has a normal mood and affect. His behavior is normal. Judgment and thought content normal.     Assessment/Plan This is a 60 year old male admitted for new onset congestive heart failure. 1. CHF: Acute; likely systolic. The patient is obese and has uncontrolled diabetes as well as intermittently controlled hypertension as risk factors for congestive heart failure. I've ordered an echocardiogram as well as cardiology consult. He has received a total of Lasix 40 mg IV and we will start a oral maintenance dose of Lasix based on his kidney function. Low sodium diet. Continue aspirin. 2. Diabetes mellitus type 2: The patient reports blood sugars of 98-118 at home. Check hemoglobin A1c. Continue basal insulin as well as sliding scale insulin. 3. Essential hypertension: Continue low-dose lisinopril and amlodipine. The patient's heart rate is mildly elevated. I will start low-dose of beta blocker to improve filling time and stroke volume. 4. Gout: Controlled. Continue allopurinol 5. Hyperlipidemia: Continue high-dose statin therapy 6. Urinary retention: No history of hydronephrosis or chronic kidney disease. The patient has a urethral stricture that is scheduled for repair later this month. Continue tamsulosin  7. DVT prophylaxis: Lovenox 8. GI prophylaxis: None The patient is a full code. Time spent on admission orders and patient care approximately 45 minutes  Harrie Foreman, MD 12/01/2015, 7:04 AM

## 2015-12-01 NOTE — ED Provider Notes (Signed)
[facility] Emergency Department Provider Note   ____________________________________________  Time seen: Approximately 4:23 AM  I have reviewed the triage vital signs and the nursing notes.   HISTORY  Chief Complaint Shortness of Breath    HPI Edward Myers is a 61 y.o. male who presents to the ED from home with a chief complaint of shortness of breath. Patient has noticed mild shortness of breath for the past 2 weeks which has become increasingly worse. He has been sleeping upright in his recliner for the past several days. Patient became more short of breath at approximately 1 AM. Notes increased swelling of bilateral lower extremities.Denies associated fever, chills, chest pain, abdominal pain, nausea, vomiting, diarrhea. Denies recent travel or trauma. 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 Nothing makes his symptoms better. Supine position makes his symptoms worse.   Past Medical History  Diagnosis Date  . Chronic gouty arthropathy   . Glaucoma   . Hyperlipidemia   . Hypertension   . Rhinitis medicamentosa   . Diabetes mellitus without complication (H[facility])   . Morbid obesity (H[facility])   . UTI (urinary tract infection)     Patient Active Problem List   Diagnosis Date Noted  . Demand ischemia (HCC) 07/26/2015  . Shock (HCC) 07/25/2015  . Hyperlipidemia   . Hypertension   . Chronic gouty arthropathy   . Diabetes mellitus with hyperglycemia [facility])     Past Surgical History  Procedure Laterality Date  . Tonsillectomy    . Circumcision      Current Outpatient Rx  Name  Route  Sig  Dispense  Refill  . allopurinol (ZYLOPRIM) 100 MG tablet   Oral   Take 100 mg by mouth daily.          Marland Kitchen aspirin EC 81 MG tablet   Oral   Take 81 mg by mouth daily.         Marland Kitchen atorvastatin (LIPITOR) 80 MG tablet      TAKE 1 BY MOUTH DAILY   30 tablet   0   . fexofenadine-pseudoephedrine  (ALLEGRA-D 24) 180-240 MG 24 hr tablet   Oral   Take 1 tablet by mouth daily as needed (for allergies).         . fluticasone (FLONASE) 50 MCG/ACT nasal spray   Each Nare   Place 2 sprays into both nostrils daily as needed for rhinitis.          Marland Kitchen insulin aspart protamine- aspart (NOVOLOG MIX 70/30) (70-30) 100 UNIT/ML injection   Subcutaneous   Inject 0.25 mLs (25 Units total) into the skin daily. Patient taking differently: Inject 25 Units into the skin every morning.    10 mL   12   . lisinopril (PRINIVIL,ZESTRIL) 2.5 MG tablet   Oral   Take 2.5 mg by mouth daily.         Marland Kitchen oxyCODONE (ROXICODONE) 5 MG immediate release tablet   Oral   Take 1 tablet (5 mg total) by mouth every 6 (six) hours as needed for severe pain.   60 tablet   0   . tamsulosin (FLOMAX) 0.4 MG CAPS capsule      TAKE 1 BY MOUTH DAILY   30 capsule   12   . amLODipine (NORVASC) 10 MG tablet   Oral   Take 1 tablet (10 mg total) by mouth daily. Patient not taking: Reported on 08/07/2015  90 tablet   1   . colchicine 0.6 MG tablet   Oral   Take 1 tablet (0.6 mg total) by mouth daily. Patient not taking: Reported on 12/01/2015   30 tablet   0   . predniSONE (DELTASONE) 10 MG tablet      6 tabs today, 5 tabs tomorrow, decrease by 1 each day until gone Patient not taking: Reported on 12/01/2015   21 tablet   0     Allergies Review of patient's allergies indicates no known allergies.  Family History  Problem Relation Age of Onset  . Heart attack Mother   . Diabetes Mother     Social History Social History  Substance Use Topics  . Smoking status: Former Smoker    Types: Cigars    Quit date: 01/25/2010  . Smokeless tobacco: Not on file  . Alcohol Use: Yes    Review of Systems  Constitutional: No fever/chills. Eyes: No visual changes. ENT: No sore throat. Cardiovascular: Denies chest pain. Respiratory: Positive for shortness of breath. Gastrointestinal: No abdominal pain.   No nausea, no vomiting.  No diarrhea.  No constipation. Genitourinary: Negative for dysuria. Musculoskeletal: Positive for BLE swelling. Negative for back pain. Skin: Negative for rash. Neurological: Negative for headaches, focal weakness or numbness.  10-point ROS otherwise negative.  ____________________________________________   PHYSICAL EXAM:  VITAL SIGNS: ED Triage Vitals  Enc Vitals Group     BP 12/01/15 0405 140/100 mmHg     Pulse Rate 12/01/15 0405 102     Resp 12/01/15 0405 24     Temp --      Temp src --      SpO2 12/01/15 0405 81 %     Weight 12/01/15 0405 274 lb (124.286 kg)     Height 12/01/15 0405 6' (1.829 m)     Head Cir --      Peak Flow --      Pain Score --      Pain Loc --      Pain Edu? --      Excl. in GC? --     Constitutional: Alert and oriented. Well appearing and in moderate acute distress. Eyes: Conjunctivae are normal. PERRL. EOMI. Head: Atraumatic. Nose: No congestion/rhinnorhea. Mouth/Throat: Mucous membranes are moist.  Oropharynx non-erythematous. Neck: No stridor.   Cardiovascular: Normal rate, regular rhythm. Grossly normal heart sounds.  Good peripheral circulation. Respiratory: Increased respiratory effort.  No retractions. Lungs with diffuse rales. Gastrointestinal: Soft and nontender. No distention. No abdominal bruits. No CVA tenderness. Musculoskeletal: No lower extremity tenderness. 2+ BLE nonpitting edema.  No joint effusions. Neurologic:  Normal speech and language. No gross focal neurologic deficits are appreciated.  Skin:  Skin is warm, dry and intact. No rash noted. Psychiatric: Mood and affect are normal. Speech and behavior are normal.  ____________________________________________   LABS (all labs ordered are listed, but only abnormal results are displayed)  Labs Reviewed  COMPREHENSIVE METABOLIC PANEL - Abnormal; Notable for the following:    Glucose, Bld 113 (*)    All other components within normal limits  BRAIN  NATRIURETIC PEPTIDE - Abnormal; Notable for the following:    B Natriuretic Peptide 369.0 (*)    All other components within normal limits  CBC WITH DIFFERENTIAL/PLATELET  TROPONIN I   ____________________________________________  EKG  ED ECG REPORT I, Odette Watanabe J, the attending physician, personally viewed and interpreted this ECG.   Date: 12/01/2015  EKG Time: 0408  Rate: 97  Rhythm: normal EKG,  normal sinus rhythm  Axis: Normal  Intervals:right bundle branch block LPFB  ST&T Change: Nonspecific  ____________________________________________  RADIOLOGY  Portable chest x-ray (viewed by me, interpreted per Dr. Andria MeuseStevens):   Cardiac enlargement. Focal airspace disease in the right lung base likely representing pneumonia. ____________________________________________   PROCEDURES  Procedure(s) performed: None  Critical Care performed: Yes, see critical care note(s)  ____________________________________________   INITIAL IMPRESSION / ASSESSMENT AND PLAN / ED COURSE  Pertinent labs & imaging results that were available during my care of the patient were reviewed by me and considered in my medical decision making (see chart for details).  60 year old male with hypertension and diabetes who presents with shortness of breath, hypoxic at 81% on room air, diffuse crackles on exam. Suspect new onset congestive heart failure. Will initiate screening lab work, chest x-ray; anticipate patient will require IV Lasix and hospital admission.  ----------------------------------------- 6:01 AM on 12/01/2015 -----------------------------------------  Patient feeling better, resting in no acute distress. Discussed with hospitalist tonight patient in the emergency department for admission.  ----------------------------------------- 7:43 AM on 12/01/2015 -----------------------------------------  Official radiology interpretation dictated after patient went upstairs to his room. I have  informed the hospitalists of x-ray findings of pneumonia. ____________________________________________   FINAL CLINICAL IMPRESSION(S) / ED DIAGNOSES  Final diagnoses:  Acute congestive heart failure, unspecified congestive heart failure type (HCC)  Hypoxia  Shortness of breath      NEW MEDICATIONS STARTED DURING THIS VISIT:  New Prescriptions   No medications on file     Note:  This document was prepared using Dragon voice recognition software and may include unintentional dictation errors.    Irean HongJade J Ramon Zanders, MD 12/01/15 [id number]

## 2015-12-01 NOTE — Progress Notes (Signed)
[facility] [place] [facility]   PATIENT NAME: Edward Myers    MR#:  [id number]  DATE OF BIRTH:  1955/09/03  SUBJECTIVE:  CHIEF COMPLAINT:   Chief Complaint  Patient presents with  . Shortness of Breath   No cough but has shortness of breath. On oxygen and it cannula 4 L. REVIEW OF SYSTEMS:  CONSTITUTIONAL: No fever, fatigue or weakness.  EYES: No blurred or double vision.  EARS, NOSE, AND THROAT: No tinnitus or ear pain.  RESPIRATORY: No cough, but has shortness of breath, no wheezing or hemoptysis.  CARDIOVASCULAR: No chest pain, orthopnea, edema.  GASTROINTESTINAL: No nausea, vomiting, diarrhea or abdominal pain.  GENITOURINARY: No dysuria, hematuria.  ENDOCRINE: No polyuria, nocturia,  HEMATOLOGY: No anemia, easy bruising or bleeding SKIN: No rash or lesion. MUSCULOSKELETAL: No joint pain or arthritis.   NEUROLOGIC: No tingling, numbness, weakness.  PSYCHIATRY: No anxiety or depression.   DRUG ALLERGIES:  No Known Allergies  VITALS:  Blood pressure 125/89, pulse 92, temperature 97.8 F (36.6 C), temperature source Oral, resp. rate 17, height 6' (1.829 m), weight 131.543 kg (290 lb), SpO2 94 %.  PHYSICAL EXAMINATION:  GENERAL:  60 y.o.-year-old patient lying in the bed with no acute distress. Morbidly obese. EYES: Pupils equal, round, reactive to light and accommodation. No scleral icterus. Extraocular muscles intact.  HEENT: Head atraumatic, normocephalic. Oropharynx and nasopharynx clear.  NECK:  Supple, no jugular venous distention. No thyroid enlargement, no tenderness.  LUNGS: Normal breath sounds bilaterally, no wheezing, rales,rhonchi or crepitation. No use of accessory muscles of respiration.  CARDIOVASCULAR: S1, S2 normal. No murmurs, rubs, or gallops.  ABDOMEN: Soft, nontender, nondistended. Bowel sounds present. No organomegaly or mass.  EXTREMITIES: Bilateral lower extremity edema 1+, no cyanosis, or clubbing.  NEUROLOGIC:  Cranial nerves II through XII are intact. Muscle strength 5/5 in all extremities. Sensation intact. Gait not checked.  PSYCHIATRIC: The patient is alert and oriented x 3.  SKIN: No obvious rash, lesion, or ulcer.    LABORATORY PANEL:   CBC  Recent Labs Lab 12/01/15 0420  WBC 6.4  HGB 13.4  HCT 40.9  PLT 170   ------------------------------------------------------------------------------------------------------------------  Chemistries   Recent Labs Lab 12/01/15 0420  NA 140  K 3.6  CL 111  CO2 24  GLUCOSE 113*  BUN 17  CREATININE 0.74  CALCIUM 9.0  AST 26  ALT 26  ALKPHOS 76  BILITOT 0.6   ------------------------------------------------------------------------------------------------------------------  Cardiac Enzymes  Recent Labs Lab 12/01/15 0420  TROPONINI 0.03   ------------------------------------------------------------------------------------------------------------------  RADIOLOGY:  Dg Chest Port 1 View  12/01/2015  CLINICAL DATA:  Shortness of breath for 2 weeks. Bilateral crackles. Edema in the lower legs. EXAM: PORTABLE CHEST 1 VIEW COMPARISON:  07/25/2015 FINDINGS: Cardiac enlargement. Focal airspace disease in the right lung base. This is likely to represent pneumonia. No blunting of costophrenic angles. No pneumothorax. Tortuous aorta. IMPRESSION: Cardiac enlargement. Focal airspace disease in the right lung base likely representing pneumonia. Electronically Signed   By: Burman NievesWilliam  Stevens M.D.   On: 12/01/2015 06:15    EKG:   Orders placed or performed during the hospital encounter of 12/01/15  . EKG 12-Lead  . EKG 12-Lead  . ED EKG  . ED EKG    ASSESSMENT AND PLAN:   This is a 60 year old male admitted for new onset congestive heart failure.  1. Acute systolic CHF and Cardiomyopathy, EF: 30% - 35%:  Continue Lasix 40 mg IV bid.  Continue aspirin. Per Dr.  Call wood,  Increase ACE inhibitor and beta-blockade therapy for heart failure  cardiomyopathy.  2. Diabetes mellitus type 2: The patient reports blood sugars of 98-118 at home. HbA1C 15.6. Continue basal insulin as well as sliding scale insulin.  3. Essential hypertension: Continue low-dose lisinopril and amlodipine. The patient's heart rate is mildly elevated. I will start low-dose of beta blocker to improve filling time and stroke volume.  4. Gout: Controlled. Continue allopurinol  5. Hyperlipidemia: Continue lipitor.  6. Urinary retention: No history of hydronephrosis or chronic kidney disease. The patient has a urethral stricture that is scheduled for repair later this month. Continue tamsulosin   Acute respiratory failure with hypoxia. Possible due to combination of CHF and pneumonia. Try to wean off oxygen, continue nebulizer when necessary.  Right lower lobe pneumonia. Start Levaquin, nebulizer when necessary.  Possible obstructive sleep apnea. Outpatient sleep study.  All the records are reviewed and case discussed with Care Management/Social Workerr. Management plans discussed with the patient, his wife and they are in agreement.  CODE STATUS: Full code  TOTAL TIME TAKING CARE OF THIS PATIENT: 41 minutes.  Greater than 50% time was spent on coordination of care and face-to-face counseling.  POSSIBLE D/C IN 2-3 DAYS, DEPENDING ON CLINICAL CONDITION.   Shaune Pollack M.D on 12/01/2015 at 2:57 PM  Between 7am to 6pm - Pager - [phone]  After 6pm go to [email or web address] - password [email or web address] [facility]  [place] [facility]  Office  [phone]  CC: Primary care physician; Lynnea Ferrier, MD

## 2015-12-01 NOTE — Consult Note (Signed)
Reason for Consult: Congestive heart failure Referring Physician: Zeb Comfort MD,  primary Rosilyn Mings hospitalist  Edward Myers is an 60 y.o. male.  HPI: Presents with progressive shortness of breath and leg edema dyspnea PND and orthopnea. Patient complains of ongoing symptoms over the last 2-3 weeks have any get up in the middle of night with dyspnea legs getting progressively larger with swelling denies any chest pain palpitations or tachycardia. Patient complains of dyspnea with minimal activity and exertion. Patient denies previous history of heart failure was admitted with possible bronchitis pneumonia placed on antibiotic therapy echocardiogram was recommended for evaluation. Patient has been seen Irven Easterly recently but shortness of breath has gotten progressively worse especially this week.  Past Medical History  Diagnosis Date  . Chronic gouty arthropathy   . Glaucoma   . Hyperlipidemia   . Hypertension   . Rhinitis medicamentosa   . Diabetes mellitus without complication ([place])   . Morbid obesity ([place])   . UTI (urinary tract infection)   . Urethral stricture     scheduled for repair May 2017    Past Surgical History  Procedure Laterality Date  . Tonsillectomy    . Circumcision      Family History  Problem Relation Age of Onset  . Heart attack Mother   . Diabetes Mother   . Congestive Heart Failure Mother     Social History:  reports that he quit smoking about 5 years ago. His smoking use included Cigars. He does not have any smokeless tobacco history on file. He reports that he drinks alcohol. His drug history is not on file.  Allergies: No Known Allergies  Medications: I have reviewed the patient's current medications.  Results for orders placed or performed during the hospital encounter of 12/01/15 (from the past 48 hour(s))  CBC with Differential     Status: None   Collection Time: 12/01/15  4:20 AM  Result Value Ref Range   WBC 6.4 3.8 - 10.6 K/uL    RBC 4.76 4.40 - 5.90 MIL/uL   Hemoglobin 13.4 13.0 - 18.0 g/dL   HCT 40.9 40.0 - 52.0 %   MCV 85.9 80.0 - 100.0 fL   MCH 28.1 26.0 - 34.0 pg   MCHC 32.7 32.0 - 36.0 g/dL   RDW 14.1 11.5 - 14.5 %   Platelets 170 150 - 440 K/uL   Neutrophils Relative % 58 %   Neutro Abs 3.8 1.4 - 6.5 K/uL   Lymphocytes Relative 31 %   Lymphs Abs 2.0 1.0 - 3.6 K/uL   Monocytes Relative 8 %   Monocytes Absolute 0.5 0.2 - 1.0 K/uL   Eosinophils Relative 2 %   Eosinophils Absolute 0.1 0 - 0.7 K/uL   Basophils Relative 1 %   Basophils Absolute 0.0 0 - 0.1 K/uL  Comprehensive metabolic panel     Status: Abnormal   Collection Time: 12/01/15  4:20 AM  Result Value Ref Range   Sodium 140 135 - 145 mmol/L   Potassium 3.6 3.5 - 5.1 mmol/L   Chloride 111 101 - 111 mmol/L   CO2 24 22 - 32 mmol/L   Glucose, Bld 113 (H) 65 - 99 mg/dL   BUN 17 6 - 20 mg/dL   Creatinine, Ser 0.74 0.61 - 1.24 mg/dL   Calcium 9.0 8.9 - 10.3 mg/dL   Total Protein 6.7 6.5 - 8.1 g/dL   Albumin 3.9 3.5 - 5.0 g/dL   AST 26 15 - 41 U/L  ALT 26 17 - 63 U/L   Alkaline Phosphatase 76 38 - 126 U/L   Total Bilirubin 0.6 0.3 - 1.2 mg/dL   GFR calc non Af Amer >60 >60 mL/min   GFR calc Af Amer >60 >60 mL/min    Comment: (NOTE) The eGFR has been calculated using the CKD EPI equation. This calculation has not been validated in all clinical situations. eGFR's persistently <60 mL/min signify possible Chronic Kidney Disease.    Anion gap 5 5 - 15  Brain natriuretic peptide     Status: Abnormal   Collection Time: 12/01/15  4:20 AM  Result Value Ref Range   B Natriuretic Peptide 369.0 (H) 0.0 - 100.0 pg/mL  Troponin I     Status: None   Collection Time: 12/01/15  4:20 AM  Result Value Ref Range   Troponin I 0.03 <0.031 ng/mL    Comment:        NO INDICATION OF MYOCARDIAL INJURY.   TSH     Status: Abnormal   Collection Time: 12/01/15  4:20 AM  Result Value Ref Range   TSH 4.563 (H) 0.350 - 4.500 uIU/mL  Glucose, capillary      Status: None   Collection Time: 12/01/15  7:51 AM  Result Value Ref Range   Glucose-Capillary 95 65 - 99 mg/dL  Glucose, capillary     Status: None   Collection Time: 12/01/15 11:32 AM  Result Value Ref Range   Glucose-Capillary 99 65 - 99 mg/dL    Dg Chest Port 1 View  12/01/2015  CLINICAL DATA:  Shortness of breath for 2 weeks. Bilateral crackles. Edema in the lower legs. EXAM: PORTABLE CHEST 1 VIEW COMPARISON:  07/25/2015 FINDINGS: Cardiac enlargement. Focal airspace disease in the right lung base. This is likely to represent pneumonia. No blunting of costophrenic angles. No pneumothorax. Tortuous aorta. IMPRESSION: Cardiac enlargement. Focal airspace disease in the right lung base likely representing pneumonia. Electronically Signed   By: Lucienne Capers M.D.   On: 12/01/2015 06:15    Review of Systems  Constitutional: Negative.   HENT: Positive for congestion.   Eyes: Negative.   Respiratory: Positive for shortness of breath.   Cardiovascular: Positive for orthopnea and leg swelling.  Gastrointestinal: Negative.   Genitourinary: Negative.   Musculoskeletal: Negative.   Skin: Negative.   Neurological: Negative.   Endo/Heme/Allergies: Negative.   Psychiatric/Behavioral: Negative.    Blood pressure 125/89, pulse 92, temperature 97.8 F (36.6 C), temperature source Oral, resp. rate 17, height 6' (1.829 m), weight 131.543 kg (290 lb), SpO2 94 %. Physical Exam  Nursing note and vitals reviewed. Constitutional: He is oriented to person, place, and time. He appears well-developed and well-nourished.  HENT:  Head: Normocephalic and atraumatic.  Eyes: Conjunctivae and EOM are normal. Pupils are equal, round, and reactive to light. Left eye discharge: ongestive heart failure.  Neck: Normal range of motion. Neck supple.  Cardiovascular: Regular rhythm, S1 normal, S2 normal and normal pulses.  PMI is displaced.  Exam reveals gallop, S3 and S4.   Murmur heard.  Systolic murmur is  present with a grade of 2/6  Respiratory: Effort normal and breath sounds normal.  GI: Soft. Bowel sounds are normal.  Musculoskeletal: Normal range of motion. He exhibits edema.  Neurological: He is alert and oriented to person, place, and time. He has normal reflexes.  Skin: Skin is warm and dry.  Psychiatric: He has a normal mood and affect.    Assessment/Plan: Congestive heart failure Cardiomyopathy Leg edema  Hyperlipidemia Hypertension Diabetes type 2 Gouty arthritis Shortness of breath Possible pneumonia Obesity Possible obstructive sleep apnea . PLAN Recommend Lasix intravenously for diuresis Agree with supplemental oxygen therapy Recommend sleep study CPAP Recommend weight loss exercise portion control Increase ACE inhibitor for heart failure cardiomyopathy Increase beta-blockade therapy for heart failure cardiomyopathy Continue Lipitor therapy for lipid management DVT prophylaxis Continue loratadine for sinus congestion Agree with insulin therapy for diabetes management Allopurinol for gout therapy Antibiotic therapy for possible bronchitis pneumonia  Denard Tuminello D. 12/01/2015, 1:18 PM

## 2015-12-01 NOTE — Progress Notes (Signed)
Initial Nutrition Assessment  DOCUMENTATION CODES:   Not applicable  INTERVENTION:  -Cater to pt prefefences  NUTRITION DIAGNOSIS:   No nutrition diagnosis at this time  GOAL:   Patient will meet greater than or equal to 90% of their needs  MONITOR:   PO intake, Weight trends, Labs  REASON FOR ASSESSMENT:   Malnutrition Screening Tool    ASSESSMENT:    60 yo male admitted with acute CHF. Pt with hx of DM, HgbA1c pending, FSBS 95 this AM  Past Medical History  Diagnosis Date  . Chronic gouty arthropathy   . Glaucoma   . Hyperlipidemia   . Hypertension   . Rhinitis medicamentosa   . Diabetes mellitus without complication ([facility])   . Morbid obesity ([facility])   . UTI (urinary tract infection)   . Urethral stricture     scheduled for repair May 2017    Diet Order:  Diet 2 gram sodium Room service appropriate?: Yes; Fluid consistency:: Thin   Energy Intake: recorded po intake 100%, appetite good at present  Skin:  Reviewed, no issues  Last BM:  no BM documented   Labs: reviewed  Meds: lasix, ss novlog, lantus  Height:   Ht Readings from Last 1 Encounters:  12/01/15 6' (1.829 m)    Weight: weight relatively stable recently, overall trending down, 12.6% in 1 year per weight encounters  Wt Readings from Last 1 Encounters:  12/01/15 290 lb (131.543 kg)   Wt Readings from Last 10 Encounters:  12/01/15 290 lb (131.543 kg)  08/07/15 275 lb (124.739 kg)  07/30/15 295 lb 8 oz (134.038 kg)  07/23/15 300 lb (136.079 kg)  10/31/14 332 lb (150.594 kg)    Ideal Body Weight:     BMI:  Body mass index is 39.32 kg/(m^2).  Estimated Nutritional Needs:   Kcal:  2200-2500  Protein:  131 g  Fluid:  >2 L  EDUCATION NEEDS:   No education needs identified at this time  Romelle StarcherCate Saniah Schroeter MS, RD, LDN [phone] Pager  [phone] Weekend/On-Call Pager

## 2015-12-01 NOTE — ED Notes (Signed)
MD Diamond at bedside. 

## 2015-12-01 NOTE — ED Notes (Signed)
Pt reports increasing shortness of breath over the last few weeks. Worse at night. O2 sats 81% in triage pt taken to room.

## 2015-12-01 NOTE — Progress Notes (Signed)
*  PRELIMINARY RESULTS* Echocardiogram 2D Echocardiogram has been performed.  Edward Myers 12/01/2015, 10:42 AM

## 2015-12-01 NOTE — Consult Note (Signed)
Pharmacy Antibiotic Note  Sheliah Hatchdward Keith Myers is a 60 y.o. male admitted on 12/01/2015 with pneumonia.  Pharmacy has been consulted for Levaquin dosing, as CXR showed possible PNA.   Plan: Pt will be initiated on levaquin 750mg  IV q24hr with recommended duration of 5 days.  Height: 6' (182.9 cm) Weight: 290 lb (131.543 kg) IBW/kg (Calculated) : 77.6  Temp (24hrs), Avg:98.4 F (36.9 C), Min:98.4 F (36.9 C), Max:98.4 F (36.9 C)   Recent Labs Lab 12/01/15 0420  WBC 6.4  CREATININE 0.74    Estimated Creatinine Clearance: 141.2 mL/min (by C-G formula based on Cr of 0.74).    No Known Allergies  Antimicrobials this admission: Levofloxacin 5/20 >>   Thank you for allowing pharmacy to be a part of this patient's care.  Roque CashAllison Beverlyann Broxterman, PharmD Pharmacy Resident 12/01/2015

## 2015-12-01 NOTE — ED Notes (Signed)
Pt O2 sat 89% on 3L [place]. Pt O2 increased to 4L

## 2015-12-01 NOTE — ED Notes (Signed)
Pt c/o SOB X 2 weeks with increasing severity at 1am this morning. Pt has crackles bilateral lungs. Pt has +1 Pitting edema of bilateral lower legs. Pt denies hx of cardiac/respiratory disorders. Pt reports hx of gout, diabetes, and prostate

## 2015-12-01 NOTE — ED Notes (Signed)
Pt O2 sat currently 94% on 4L [place]

## 2015-12-02 LAB — GLUCOSE, CAPILLARY
Glucose-Capillary: 111 mg/dL — ABNORMAL HIGH (ref 65–99)
Glucose-Capillary: 118 mg/dL — ABNORMAL HIGH (ref 65–99)
Glucose-Capillary: 103 mg/dL — ABNORMAL HIGH (ref 65–99)
Glucose-Capillary: 108 mg/dL — ABNORMAL HIGH (ref 65–99)

## 2015-12-02 NOTE — Progress Notes (Addendum)
[facility] [place] [facility]   PATIENT NAME: Edward Myers    MR#:  [id number]  DATE OF BIRTH:  11/04/1955  SUBJECTIVE:  CHIEF COMPLAINT:   Chief Complaint  Patient presents with  . Shortness of Breath   Better shortness of breath. On oxygen and it cannula 3 L. REVIEW OF SYSTEMS:  CONSTITUTIONAL: No fever, fatigue or weakness.  EYES: No blurred or double vision.  EARS, NOSE, AND THROAT: No tinnitus or ear pain.  RESPIRATORY: No cough, but has shortness of breath, no wheezing or hemoptysis.  CARDIOVASCULAR: No chest pain, orthopnea, edema.  GASTROINTESTINAL: No nausea, vomiting, diarrhea or abdominal pain.  GENITOURINARY: No dysuria, hematuria.  ENDOCRINE: No polyuria, nocturia,  HEMATOLOGY: No anemia, easy bruising or bleeding SKIN: No rash or lesion. MUSCULOSKELETAL: No joint pain or arthritis.   NEUROLOGIC: No tingling, numbness, weakness.  PSYCHIATRY: No anxiety or depression.   DRUG ALLERGIES:  No Known Allergies  VITALS:  Blood pressure 116/73, pulse 76, temperature 98.1 F (36.7 C), temperature source Oral, resp. rate 18, height 6' (1.829 m), weight 132.224 kg (291 lb 8 oz), SpO2 96 %.  PHYSICAL EXAMINATION:  GENERAL:  [age] y.o.-year-old patient lying in the bed with no acute distress. Morbidly obese. EYES: Pupils equal, round, reactive to light and accommodation. No scleral icterus. Extraocular muscles intact.  HEENT: Head atraumatic, normocephalic. Oropharynx and nasopharynx clear.  NECK:  Supple, no jugular venous distention. No thyroid enlargement, no tenderness.  LUNGS: Normal breath sounds bilaterally, no wheezing, rales,rhonchi or crepitation. No use of accessory muscles of respiration.  CARDIOVASCULAR: S1, S2 normal. No murmurs, rubs, or gallops.  ABDOMEN: Soft, nontender, nondistended. Bowel sounds present. No organomegaly or mass.  EXTREMITIES: Bilateral lower extremity edema 1+, no cyanosis, or clubbing.  NEUROLOGIC: Cranial  nerves II through XII are intact. Muscle strength 5/5 in all extremities. Sensation intact. Gait not checked.  PSYCHIATRIC: The patient is alert and oriented x 3.  SKIN: No obvious rash, lesion, or ulcer.    LABORATORY PANEL:   CBC  Recent Labs Lab 12/01/15 0420  WBC 6.4  HGB 13.4  HCT 40.9  PLT 170   ------------------------------------------------------------------------------------------------------------------  Chemistries   Recent Labs Lab 12/01/15 0420  NA 140  K 3.6  CL 111  CO2 24  GLUCOSE 113*  BUN 17  CREATININE 0.74  CALCIUM 9.0  AST 26  ALT 26  ALKPHOS 76  BILITOT 0.6   ------------------------------------------------------------------------------------------------------------------  Cardiac Enzymes  Recent Labs Lab 12/01/15 0420  TROPONINI 0.03   ------------------------------------------------------------------------------------------------------------------  RADIOLOGY:  Dg Chest Port 1 View  12/01/2015  CLINICAL DATA:  Shortness of breath for 2 weeks. Bilateral crackles. Edema in the lower legs. EXAM: PORTABLE CHEST 1 VIEW COMPARISON:  07/25/2015 FINDINGS: Cardiac enlargement. Focal airspace disease in the right lung base. This is likely to represent pneumonia. No blunting of costophrenic angles. No pneumothorax. Tortuous aorta. IMPRESSION: Cardiac enlargement. Focal airspace disease in the right lung base likely representing pneumonia. Electronically Signed   By: Burman NievesWilliam  Stevens M.D.   On: 12/01/2015 06:15    EKG:   Orders placed or performed during the hospital encounter of 12/01/15  . EKG 12-Lead  . EKG 12-Lead  . ED EKG  . ED EKG    ASSESSMENT AND PLAN:   This is a 60 year old male admitted for new onset congestive heart failure.  1. Acute systolic CHF and Cardiomyopathy, EF: 30% - 35%:  Continue Lasix 40 mg IV bid. May decreased to 40 mg by  mouth daily after discharge. Continue aspirin. Per Dr. Juliann Pares,  Increase ACE inhibitor  and beta-blockade therapy for heart failure cardiomyopathy.  Per Dr. Juliann Pares, will consider evaluation for new onset heart failure cardiac catheterization versus functional study.  2. Diabetes mellitus type 2:  HbA1C 15.6. Continue basal insulin as well as sliding scale insulin.  3. Essential hypertension:  Increase lisinopril to 5 mg twice a day and continue Coreg.  4. Gout: Controlled. Continue allopurinol  5. Hyperlipidemia: Continue lipitor.  6. Urinary retention: No history of hydronephrosis or chronic kidney disease. The patient has a urethral stricture that is scheduled for repair later this month. Continue tamsulosin   Acute respiratory failure with hypoxia. Possible due to combination of CHF and pneumonia. Try to wean off oxygen, continue nebulizer when necessary.  Right lower lobe pneumonia. Continue Levaquin, nebulizer when necessary.  Possible obstructive sleep apnea. Outpatient sleep study.  Discussed with Dr. Juliann Pares. All the records are reviewed and case discussed with Care Management/Social Workerr. Management plans discussed with the patient, his wife and they are in agreement.  CODE STATUS: Full code  TOTAL TIME TAKING CARE OF THIS PATIENT: 37 minutes.  Greater than 50% time was spent on coordination of care and face-to-face counseling.  POSSIBLE D/C IN 2 DAYS, DEPENDING ON CLINICAL CONDITION.   Shaune Pollack M.D on 12/02/2015 at 1:08 PM  Between 7am to 6pm - Pager - [phone]  After 6pm go to [email or web address] - password [email or web address] [facility]  [facility] [facility]  Office  [phone]  CC: Primary care physician; Lynnea Ferrier, MD

## 2015-12-02 NOTE — Progress Notes (Signed)
Subjective:  Still short of breath persistent leg edema improving no chest pain no vertigo in general is beginning to feel better  Objective:  Vital Signs in the last 24 hours: Temp:  [97.8 F (36.6 C)-98 F (36.7 C)] 98 F (36.7 C) (05/21 0450) Pulse Rate:  [72-92] 72 (05/21 0604) Resp:  [17-18] 18 (05/21 0450) BP: (109-125)/(64-89) 111/69 mmHg (05/21 0450) SpO2:  [88 %-100 %] 95 % (05/21 0604) Weight:  [132.224 kg (291 lb 8 oz)] 132.224 kg (291 lb 8 oz) (05/21 0350)  Intake/Output from previous day: 05/20 0701 - 05/21 0700 In: 870 [P.O.:720; IV Piggyback:150] Out: 2375 [Urine:2375] Intake/Output from this shift: Total I/O In: 480 [P.O.:480] Out: 400 [Urine:400]  Physical Exam: General appearance: alert, cooperative and no distress Neck: no adenopathy, no carotid bruit, no JVD, supple, symmetrical, trachea midline and thyroid not enlarged, symmetric, no tenderness/mass/nodules Lungs: bilateral lower lobe crackles rhonchi no wheezing Heart: regular rate and rhythm, S3 present and S4 present Abdomen: soft, non-tender; bowel sounds normal; no masses,  no organomegaly Extremities: edema bilateral 2-3+ edema extremities Pulses: 2+ and symmetric Skin: Skin color, texture, turgor normal. No rashes or lesions Neurologic: Alert and oriented X 3, normal strength and tone. Normal symmetric reflexes. Normal coordination and gait  Lab Results:  Recent Labs  12/01/15 0420  WBC 6.4  HGB 13.4  PLT 170    Recent Labs  12/01/15 0420  NA 140  K 3.6  CL 111  CO2 24  GLUCOSE 113*  BUN 17  CREATININE 0.74    Recent Labs  12/01/15 0420  TROPONINI 0.03   Hepatic Function Panel  Recent Labs  12/01/15 0420  PROT 6.7  ALBUMIN 3.9  AST 26  ALT 26  ALKPHOS 76  BILITOT 0.6   No results for input(s): CHOL in the last 72 hours. No results for input(s): PROTIME in the last 72 hours.  Imaging: Imaging results have been reviewed  Cardiac Studies:  Assessment/Plan:   Cardiomyopathy CHF Edema Shortness of Breath  Hypertension Possible bronchitis Obesity Diabetes BPH Gout . PLAN Continue hospitalization hopefully will be ready discharge soon Supplemental oxygen as needed Advanced ACE inhibitor beta blocker Reduce Lasix to once a day Continue blood pressure control Agree with Lipitor for lipid management Continue Levaquin for presumed bronchitis Recommend weight loss exercise portion control DVT prophylaxis until footlamberts RN discharged We will consider evaluation for new onset heart failure cardiac catheterization versus functional study Continue blood pressure management Strongly consider evaluation for obstructive sleep apnea as an outpatient  LOS: 1 day    CALLWOOD,DWAYNE D. 12/02/2015, 10:27 AM

## 2015-12-03 LAB — GLUCOSE, CAPILLARY
Glucose-Capillary: 115 mg/dL — ABNORMAL HIGH (ref 65–99)
Glucose-Capillary: 108 mg/dL — ABNORMAL HIGH (ref 65–99)

## 2015-12-03 LAB — BASIC METABOLIC PANEL
ANION GAP: 6 (ref 5–15)
BUN: 21 mg/dL — ABNORMAL HIGH (ref 6–20)
CHLORIDE: 102 mmol/L (ref 101–111)
CO2: 31 mmol/L (ref 22–32)
Calcium: 9.1 mg/dL (ref 8.9–10.3)
Creatinine, Ser: 0.8 mg/dL (ref 0.61–1.24)
GFR calc non Af Amer: >60 mL/min (ref >60)
Glucose, Bld: 115 mg/dL — ABNORMAL HIGH (ref 65–99)
POTASSIUM: 3 mmol/L — AB (ref 3.5–5.1)
Sodium: 139 mmol/L (ref 135–145)

## 2015-12-03 LAB — MAGNESIUM: Magnesium: 1.7 mg/dL (ref 1.7–2.4)

## 2015-12-03 MED ORDER — FUROSEMIDE 40 MG PO TABS: 40.0000 mg | ORAL_TABLET | Freq: Every day | ORAL | Status: DC

## 2015-12-03 MED ORDER — LISINOPRIL 5 MG PO TABS: 5.0000 mg | ORAL_TABLET | Freq: Two times a day (BID) | ORAL | Status: DC

## 2015-12-03 MED ORDER — CARVEDILOL 12.5 MG PO TABS: 12.5000 mg | ORAL_TABLET | Freq: Two times a day (BID) | ORAL | Status: DC

## 2015-12-03 MED ORDER — POTASSIUM CHLORIDE CRYS ER 20 MEQ PO TBCR
40.0000 meq | EXTENDED_RELEASE_TABLET | ORAL | Status: AC
  Administered 2015-12-03 (×2): 40 meq via ORAL
  Filled 2015-12-03 (×2): qty 2

## 2015-12-03 MED ORDER — FUROSEMIDE 40 MG PO TABS
40.0000 mg | ORAL_TABLET | Freq: Every day | ORAL | Status: DC
  Administered 2015-12-03: 40 mg via ORAL
  Filled 2015-12-03: qty 1

## 2015-12-03 MED ORDER — LEVOFLOXACIN 750 MG PO TABS: 750.0000 mg | ORAL_TABLET | Freq: Every day | ORAL | Status: DC

## 2015-12-03 MED ORDER — POTASSIUM CHLORIDE CRYS ER 10 MEQ PO TBCR: 40.0000 meq | EXTENDED_RELEASE_TABLET | Freq: Every day | ORAL | Status: DC

## 2015-12-03 NOTE — Discharge Summary (Signed)
[facility] Physicians - [place] at [facility]   PATIENT NAME: Edward Myers    MR#:  [id number]  DATE OF BIRTH:  1955-08-20  DATE OF ADMISSION:  12/01/2015 ADMITTING PHYSICIAN: Arnaldo Natal, MD  DATE OF DISCHARGE: 12/03/2015 PRIMARY CARE PHYSICIAN: Curtis Sites III, MD    ADMISSION DIAGNOSIS:  Shortness of breath [R06.02] Hypoxia [R09.02] Acute congestive heart failure, unspecified congestive heart failure type (HCC) [I50.9]   DISCHARGE DIAGNOSIS:  Acute systolic CHF and Cardiomyopathy, EF: 30% - 35%. Right lower lobe pneumonia SECONDARY DIAGNOSIS:   Past Medical History  Diagnosis Date  . Chronic gouty arthropathy   . Glaucoma   . Hyperlipidemia   . Hypertension   . Rhinitis medicamentosa   . Diabetes mellitus without complication (HCC)   . Morbid obesity (HCC)   . UTI (urinary tract infection)   . Urethral stricture     scheduled for repair May 2017    HOSPITAL COURSE:   This is a 60 year old male admitted for new onset congestive heart failure.  1. Acute systolic CHF and Cardiomyopathy, EF: 30% - 35%:  He was treated with Lasix 40 mg IV bid, decreased to 40 mg by mouth daily. Continue aspirin. Per Dr. Juliann Pares, Increase ACE inhibitor and beta-blockade therapy for heart failure cardiomyopathy.  Per Dr. Juliann Pares, will consider evaluation for new onset heart failure cardiac catheterization versus functional study, follow up as outpatient.  2. Diabetes mellitus type 2: HbA1C 15.6. Continue basal insulin as well as sliding scale insulin.  3. Essential hypertension: Increased lisinopril to 5 mg twice a day and continue Coreg.  4. Gout: Controlled. Continue allopurinol  5. Hyperlipidemia: Continue lipitor.  6. Urinary retention: No history of hydronephrosis or chronic kidney disease. The patient has a urethral stricture that is scheduled for repair later this month. Continue tamsulosin   Acute respiratory failure with hypoxia. Possible  due to combination of CHF and pneumonia. Try to wean off oxygen, continue nebulizer when necessary.  Right lower lobe pneumonia. Continue Levaquin, nebulizer when necessary.  Possible obstructive sleep apnea. Outpatient sleep study.  Hypokalemia. Given KCl. Follow up BMP with PCP.  DISCHARGE CONDITIONS:   Stable, discharge to home today.  CONSULTS OBTAINED:  Treatment Team:  Alwyn Pea, MD  DRUG ALLERGIES:  No Known Allergies  DISCHARGE MEDICATIONS:   Current Discharge Medication List    START taking these medications   Details  carvedilol (COREG) 12.5 MG tablet Take 1 tablet (12.5 mg total) by mouth 2 (two) times daily with a meal. Qty: 30 tablet, Refills: 0    furosemide (LASIX) 40 MG tablet Take 1 tablet (40 mg total) by mouth daily. Qty: 30 tablet, Refills: 0    levofloxacin (LEVAQUIN) 750 MG tablet Take 1 tablet (750 mg total) by mouth daily. Qty: 5 tablet, Refills: 0    potassium chloride SA (K-DUR,KLOR-CON) 10 MEQ tablet Take 4 tablets (40 mEq total) by mouth daily. Qty: 10 tablet, Refills: 0      CONTINUE these medications which have CHANGED   Details  lisinopril (PRINIVIL,ZESTRIL) 5 MG tablet Take 1 tablet (5 mg total) by mouth 2 (two) times daily. Qty: 60 tablet, Refills: 0      CONTINUE these medications which have NOT CHANGED   Details  allopurinol (ZYLOPRIM) 100 MG tablet Take 100 mg by mouth daily.     aspirin EC 81 MG tablet Take 81 mg by mouth daily.    atorvastatin (LIPITOR) 80 MG tablet TAKE 1 BY MOUTH DAILY Qty:  30 tablet, Refills: 0    fexofenadine-pseudoephedrine (ALLEGRA-D 24) 180-240 MG 24 hr tablet Take 1 tablet by mouth daily as needed (for allergies).    fluticasone (FLONASE) 50 MCG/ACT nasal spray Place 2 sprays into both nostrils daily as needed for rhinitis.     insulin aspart protamine- aspart (NOVOLOG MIX 70/30) (70-30) 100 UNIT/ML injection Inject 0.25 mLs (25 Units total) into the skin daily. Qty: 10 mL, Refills: 12     tamsulosin (FLOMAX) 0.4 MG CAPS capsule TAKE 1 BY MOUTH DAILY Qty: 30 capsule, Refills: 12    colchicine 0.6 MG tablet Take 1 tablet (0.6 mg total) by mouth daily. Qty: 30 tablet, Refills: 0      STOP taking these medications     oxyCODONE (ROXICODONE) 5 MG immediate release tablet      amLODipine (NORVASC) 10 MG tablet      predniSONE (DELTASONE) 10 MG tablet          DISCHARGE INSTRUCTIONS:    If you experience worsening of your admission symptoms, develop shortness of breath, life threatening emergency, suicidal or homicidal thoughts you must seek medical attention immediately by calling 911 or calling your MD immediately  if symptoms less severe.  You Must read complete instructions/literature along with all the possible adverse reactions/side effects for all the Medicines you take and that have been prescribed to you. Take any new Medicines after you have completely understood and accept all the possible adverse reactions/side effects.   Please note  You were cared for by a hospitalist during your hospital stay. If you have any questions about your discharge medications or the care you received while you were in the hospital after you are discharged, you can call the unit and asked to speak with the hospitalist on call if the hospitalist that took care of you is not available. Once you are discharged, your primary care physician will handle any further medical issues. Please note that NO REFILLS for any discharge medications will be authorized once you are discharged, as it is imperative that you return to your primary care physician (or establish a relationship with a primary care physician if you do not have one) for your aftercare needs so that they can reassess your need for medications and monitor your lab values.    Today   SUBJECTIVE    No complaint.  VITAL SIGNS:  Blood pressure 113/76, pulse 98, temperature 98.3 F (36.8 C), temperature source Oral, resp. rate  17, height 6' (1.829 m), weight 130.455 kg (287 lb 9.6 oz), SpO2 97 %.  I/O:   Intake/Output Summary (Last 24 hours) at 12/03/15 1421 Last data filed at 12/03/15 1300  Gross per 24 hour  Intake    720 ml  Output   2800 ml  Net  -2080 ml    PHYSICAL EXAMINATION:  GENERAL:  60 y.o.-year-old patient lying in the bed with no acute distress.  EYES: Pupils equal, round, reactive to light and accommodation. No scleral icterus. Extraocular muscles intact.  HEENT: Head atraumatic, normocephalic. Oropharynx and nasopharynx clear.  NECK:  Supple, no jugular venous distention. No thyroid enlargement, no tenderness.  LUNGS: Normal breath sounds bilaterally, no wheezing, rales,rhonchi or crepitation. No use of accessory muscles of respiration.  CARDIOVASCULAR: S1, S2 normal. No murmurs, rubs, or gallops.  ABDOMEN: Soft, non-tender, non-distended. Bowel sounds present. No organomegaly or mass.  EXTREMITIES: No pedal edema, cyanosis, or clubbing.  NEUROLOGIC: Cranial nerves II through XII are intact. Muscle strength 5/5 in all extremities.  Sensation intact. Gait not checked.  PSYCHIATRIC: The patient is alert and oriented x 3.  SKIN: No obvious rash, lesion, or ulcer.   DATA REVIEW:   CBC  Recent Labs Lab 12/01/15 0420  WBC 6.4  HGB 13.4  HCT 40.9  PLT 170    Chemistries   Recent Labs Lab 12/01/15 0420 12/03/15 0342  NA 140 139  K 3.6 3.0*  CL 111 102  CO2 24 31  GLUCOSE 113* 115*  BUN 17 21*  CREATININE 0.74 0.80  CALCIUM 9.0 9.1  MG  --  1.7  AST 26  --   ALT 26  --   ALKPHOS 76  --   BILITOT 0.6  --     Cardiac Enzymes  Recent Labs Lab 12/01/15 0420  TROPONINI 0.03    Microbiology Results  Results for orders placed or performed during the hospital encounter of 07/25/15  Blood Culture (routine x 2)     Status: None   Collection Time: 07/25/15 12:48 PM  Result Value Ref Range Status   Specimen Description BLOOD LEFT HAND  Final   Special Requests   Final     BOTTLES DRAWN AEROBIC AND ANAEROBIC AEROBIC 14ML ANAEROBIC 11ML   Culture NO GROWTH 6 DAYS  Final   Report Status 07/31/2015 FINAL  Final  Urine culture     Status: None   Collection Time: 07/25/15 12:48 PM  Result Value Ref Range Status   Specimen Description URINE, RANDOM  Final   Special Requests NONE  Final   Culture NO GROWTH  Final   Report Status 07/26/2015 FINAL  Final  Blood Culture (routine x 2)     Status: None   Collection Time: 07/25/15  1:05 PM  Result Value Ref Range Status   Specimen Description BLOOD RIGHT FATTY CASTS  Final   Special Requests   Final    BOTTLES DRAWN AEROBIC AND ANAEROBIC ANA 5CC AERO 10CC   Culture NO GROWTH 6 DAYS  Final   Report Status 07/31/2015 FINAL  Final  MRSA PCR Screening     Status: None   Collection Time: 07/25/15  8:45 PM  Result Value Ref Range Status   MRSA by PCR NEGATIVE NEGATIVE Final    Comment:        The GeneXpert MRSA Assay (FDA approved for NASAL specimens only), is one component of a comprehensive MRSA colonization surveillance program. It is not intended to diagnose MRSA infection nor to guide or monitor treatment for MRSA infections.     RADIOLOGY:  No results found.      Management plans discussed with the patient, family and they are in agreement.  CODE STATUS:     Code Status Orders        Start     Ordered   12/01/15 0744  Full code   Continuous     12/01/15 0743    Code Status History    Date Active Date Inactive Code Status Order ID Comments User Context   07/25/2015  8:27 PM 07/31/2015  7:01 PM Full Code [id number]  Houston SirenVivek J Sainani, MD Inpatient      TOTAL TIME TAKING CARE OF THIS PATIENT: 35 minutes.    Shaune Pollackhen, Momin Misko M.D on 12/03/2015 at 2:21 PM  Between 7am to 6pm - Pager - [phone]  After 6pm go to [email or web address] - password [email or web address] [facility]  [facility]  Office  [phone]  CC: Primary care physician; Lynnea FerrierBERT J KLEIN III, MD

## 2015-12-03 NOTE — Progress Notes (Signed)
IV and tele were removed. Discharge instructions, follow-up appointments, and prescriptions were provided to the pt. All questions answered. Education on Low sodium diet was given to the pt also.

## 2015-12-03 NOTE — Progress Notes (Signed)
Pt was 97-99% on RA while ambulating 2 laps around the nurses station at moderate pace. No DOE.

## 2015-12-03 NOTE — Care Management (Addendum)
Met with patient. O2 is acute. Patient will need O2 assessment documented in template. Referral to [facility]. MD updated. Patient may discharge when portable tank arrives to this room. No further RNCM needs. Per RN patient's O2 sats were normal with ambulation. No O2 needs. Corene Cornea with [facility] notified. Case closed.

## 2015-12-03 NOTE — Discharge Instructions (Signed)
Heart healthy  And ADA diet. Activity as tolerated.

## 2016-02-01 ENCOUNTER — Other Ambulatory Visit: Payer: Self-pay | Admitting: Family Medicine

## 2016-02-01 NOTE — Telephone Encounter (Signed)
Patient appears to have not been seen other than hospital related visits for at least a year. Needs appt before any more refills.

## 2016-02-01 NOTE — Telephone Encounter (Signed)
Pt was sent a letter in May advising him he needed a visit before any more refills.  I will send another letter letting him no there will not be any more refills until seen.

## 2016-02-05 ENCOUNTER — Encounter: Payer: Self-pay | Admitting: Family Medicine

## 2016-02-24 IMAGING — US US EXTREM LOW VENOUS*L*
1 series · 13 of 24 positions shown · non-contrast
Comparison: None.

CLINICAL DATA: Left lower extremity pain and edema. Evaluate for
DVT.



[Series 1: us extrem low venous*left* · 0.10mm/px · 13 of 36 slices shown]
[im 1/36]
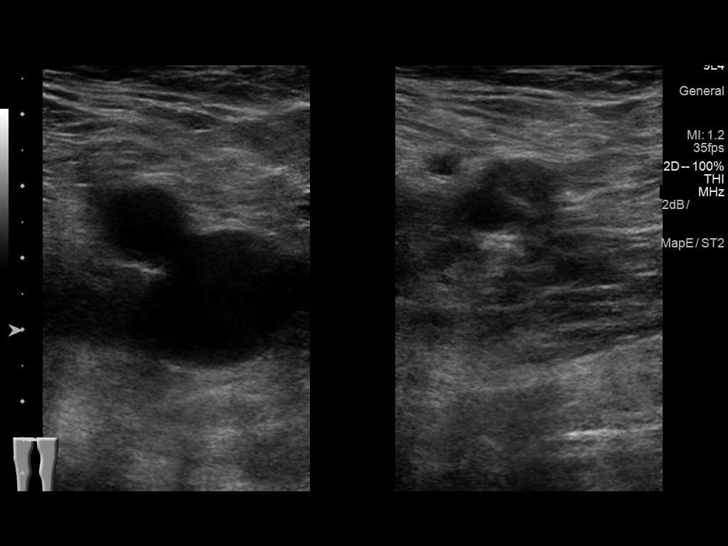
[im 4/36]
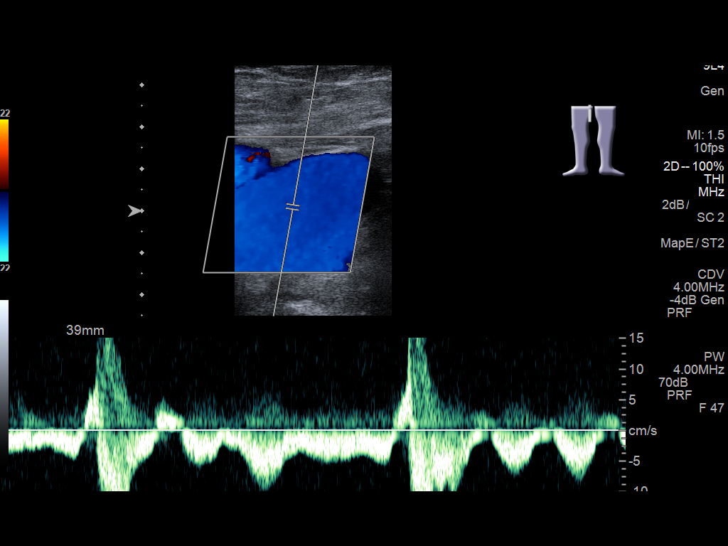
[im 7/36]
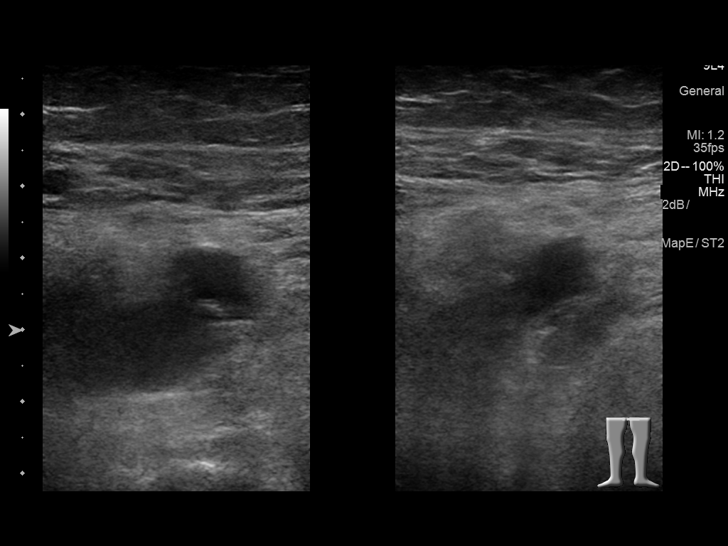
[im 10/36]
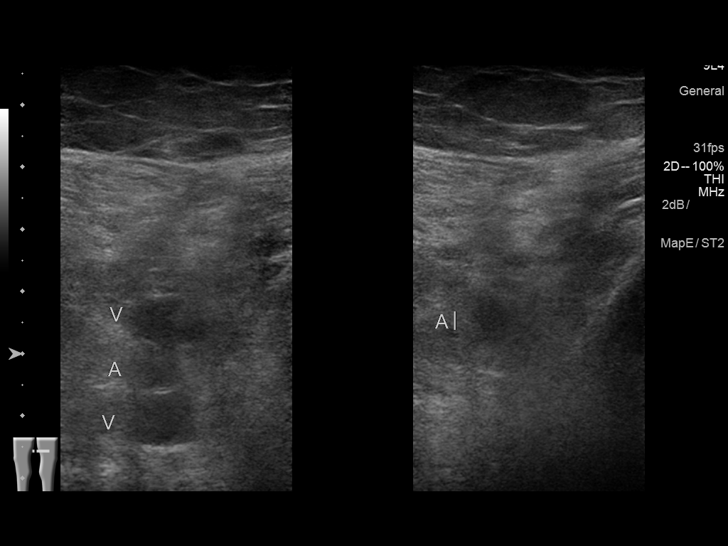
[im 13/36]
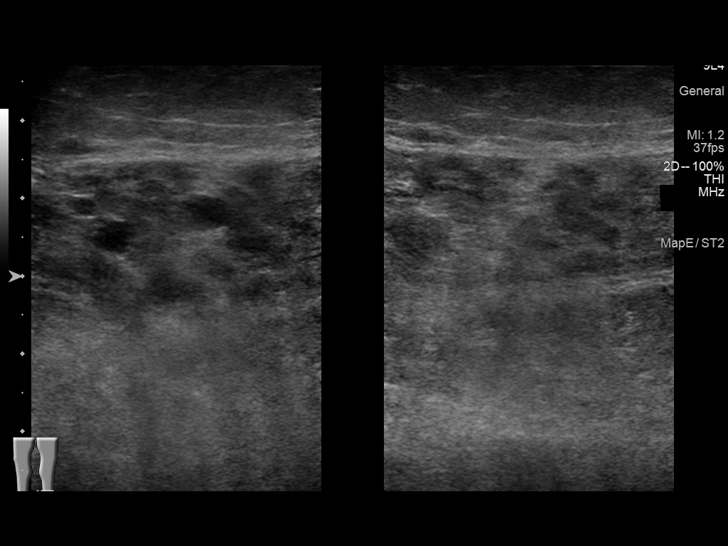
[im 16/36]
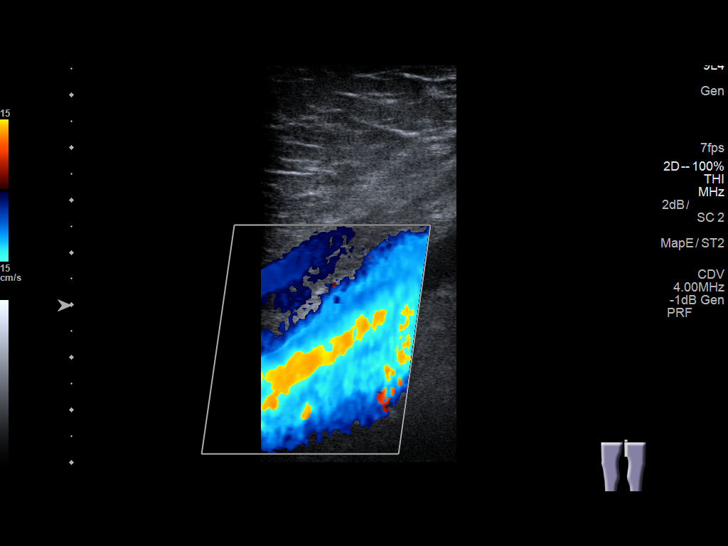
[im 19/36]
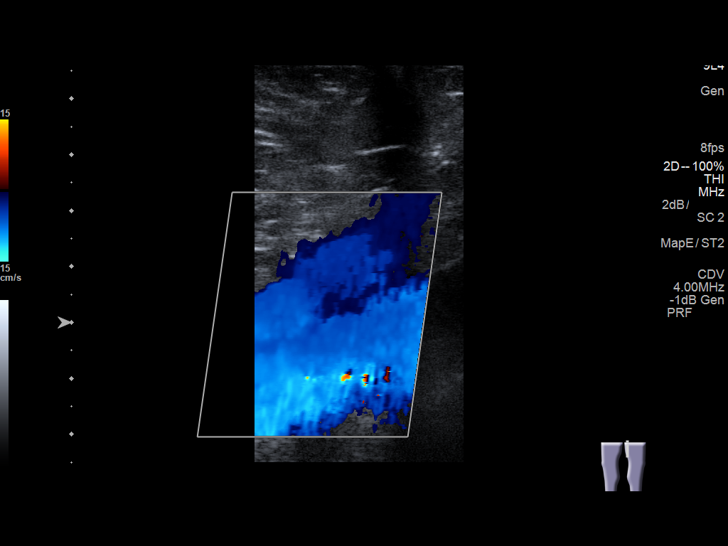
[im 20/36]
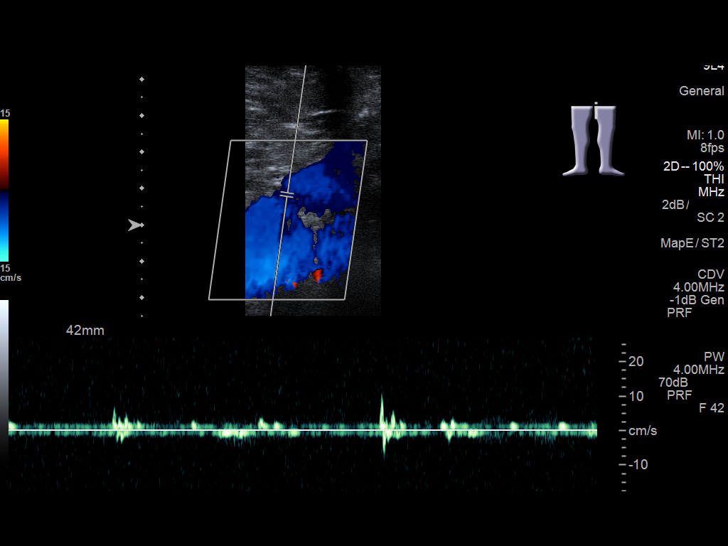
[im 23/36]
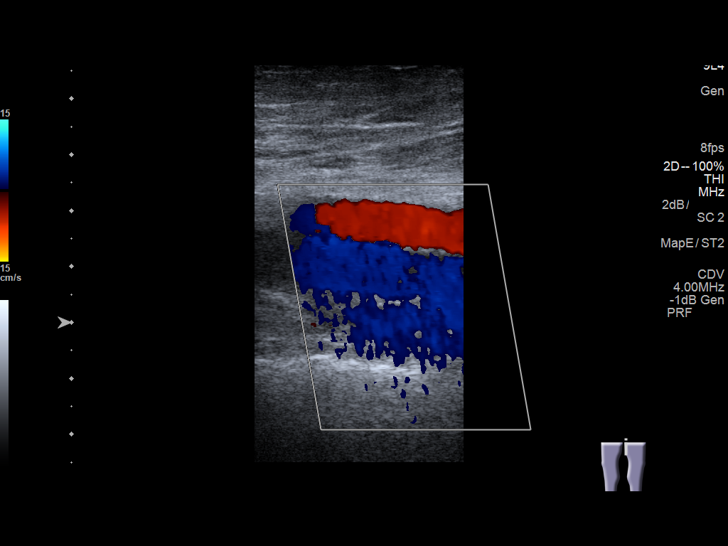
[im 26/36]
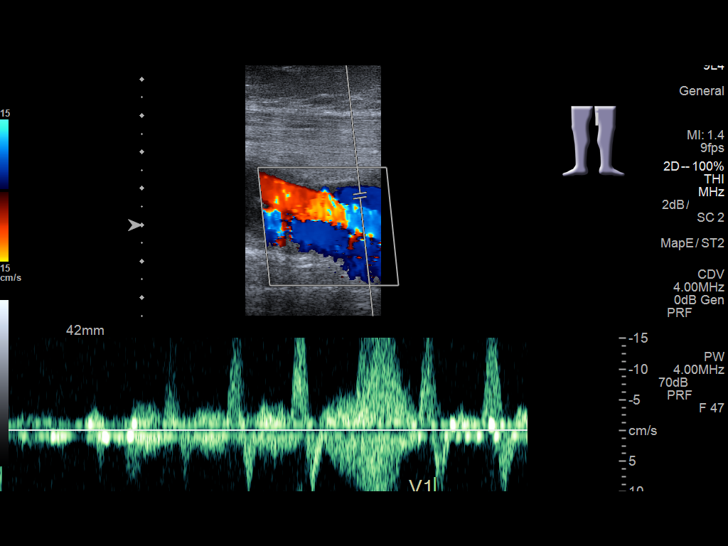
[im 29/36]
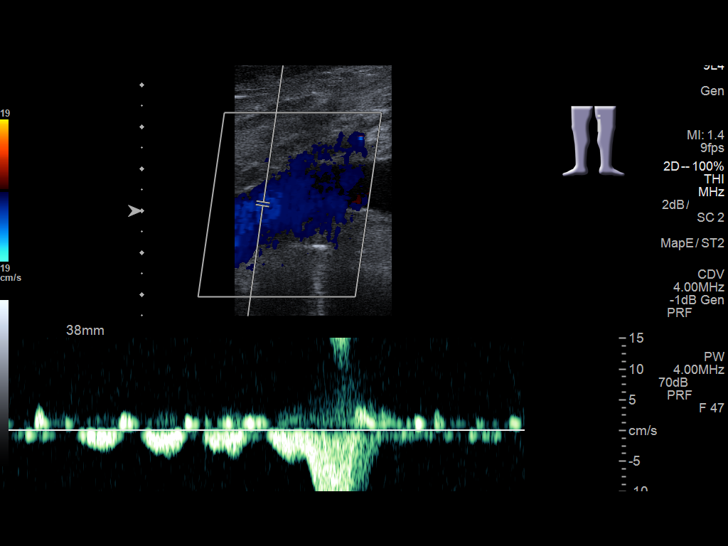
[im 32/36]
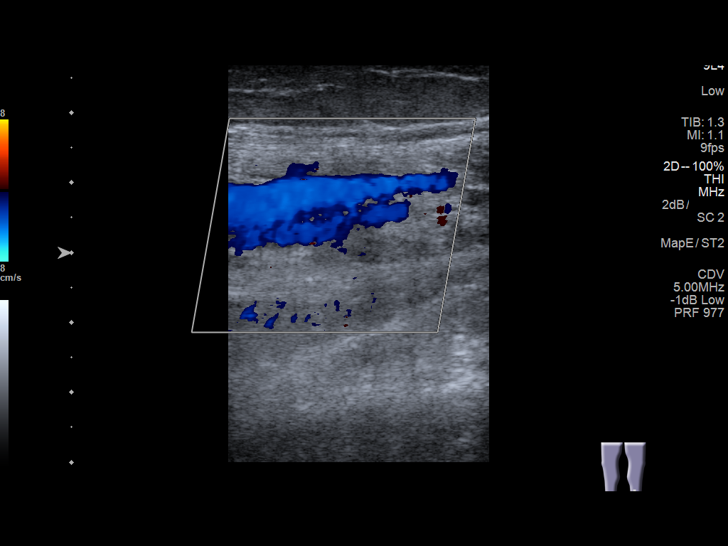
[im 36/36]
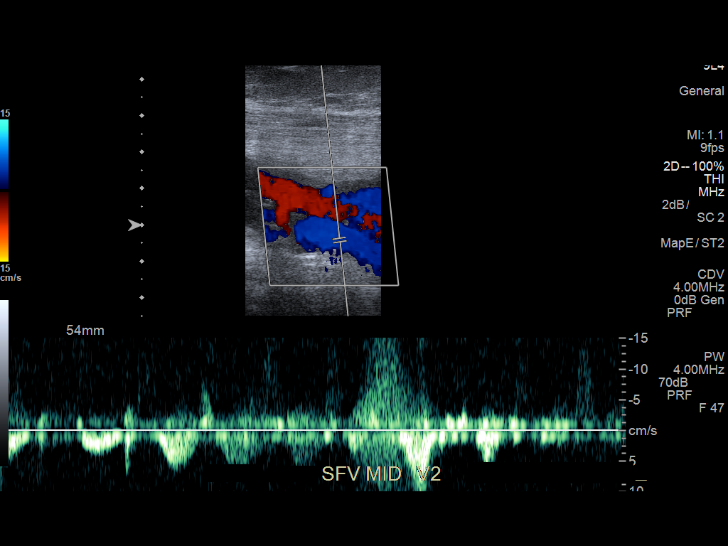

[13 of 24 positions shown; findings below may reference images not displayed]

FINDINGS: Contralateral Common Femoral Vein: Respiratory phasicity is normal
and symmetric with the symptomatic side. No evidence of thrombus.
Normal compressibility.

Common Femoral Vein: No evidence of thrombus. Normal
compressibility, respiratory phasicity and response to augmentation.

Saphenofemoral Junction: No evidence of thrombus. Normal
compressibility and flow on color Doppler imaging.

Profunda Femoral Vein: No evidence of thrombus. Normal
compressibility and flow on color Doppler imaging.

Femoral Vein: No evidence of thrombus. Normal compressibility,
respiratory phasicity and response to augmentation.

Popliteal Vein: No evidence of thrombus. Normal compressibility,
respiratory phasicity and response to augmentation.

Calf Veins: No evidence of thrombus. Normal compressibility and flow
on color Doppler imaging.

Superficial Great Saphenous Vein: No evidence of thrombus. Normal
compressibility and flow on color Doppler imaging.

Venous Reflux:  None.

Other Findings:  None.
IMPRESSION: No evidence of DVT within the left lower extremity.

## 2016-03-24 ENCOUNTER — Other Ambulatory Visit: Payer: Self-pay | Admitting: Family Medicine

## 2016-05-06 ENCOUNTER — Other Ambulatory Visit: Payer: Self-pay | Admitting: Physical Medicine and Rehabilitation

## 2016-05-06 DIAGNOSIS — M5136 Other intervertebral disc degeneration, lumbar region: Secondary | ICD-10-CM

## 2016-05-16 ENCOUNTER — Encounter (INDEPENDENT_AMBULATORY_CARE_PROVIDER_SITE_OTHER): Payer: Self-pay

## 2016-05-16 ENCOUNTER — Ambulatory Visit
Admission: RE | Admit: 2016-05-16 | Discharge: 2016-05-16 | Disposition: A | Discharge status: Home / Self Care | Payer: BLUE CROSS/BLUE SHIELD | Source: Ambulatory Visit | Attending: Physical Medicine and Rehabilitation | Admitting: Physical Medicine and Rehabilitation

## 2016-05-16 DIAGNOSIS — M48061 Spinal stenosis, lumbar region without neurogenic claudication: Secondary | ICD-10-CM | POA: Diagnosis not present

## 2016-05-16 DIAGNOSIS — M5116 Intervertebral disc disorders with radiculopathy, lumbar region: Secondary | ICD-10-CM | POA: Diagnosis not present

## 2016-05-16 DIAGNOSIS — M5416 Radiculopathy, lumbar region: Secondary | ICD-10-CM | POA: Diagnosis present

## 2016-05-16 DIAGNOSIS — M5136 Other intervertebral disc degeneration, lumbar region: Secondary | ICD-10-CM

## 2016-05-16 DIAGNOSIS — M51369 Other intervertebral disc degeneration, lumbar region without mention of lumbar back pain or lower extremity pain: Secondary | ICD-10-CM

## 2016-10-05 ENCOUNTER — Emergency Department: Payer: BLUE CROSS/BLUE SHIELD

## 2016-10-05 ENCOUNTER — Inpatient Hospital Stay
Admit: 2016-10-05 | Discharge: 2016-10-05 | Disposition: A | Discharge status: Home / Self Care | Payer: BLUE CROSS/BLUE SHIELD | Attending: Cardiology | Admitting: Cardiology

## 2016-10-05 ENCOUNTER — Inpatient Hospital Stay
Admission: EM | Admit: 2016-10-05 | Discharge: 2016-10-08 | DRG: 242 | Disposition: A | Discharge status: Home / Self Care | Payer: BLUE CROSS/BLUE SHIELD | Attending: Internal Medicine | Admitting: Internal Medicine

## 2016-10-05 ENCOUNTER — Encounter: Payer: Self-pay | Admitting: Emergency Medicine

## 2016-10-05 DIAGNOSIS — H409 Unspecified glaucoma: Secondary | ICD-10-CM | POA: Diagnosis present

## 2016-10-05 DIAGNOSIS — Z79899 Other long term (current) drug therapy: Secondary | ICD-10-CM | POA: Diagnosis not present

## 2016-10-05 DIAGNOSIS — E119 Type 2 diabetes mellitus without complications: Secondary | ICD-10-CM | POA: Diagnosis present

## 2016-10-05 DIAGNOSIS — N400 Enlarged prostate without lower urinary tract symptoms: Secondary | ICD-10-CM | POA: Diagnosis present

## 2016-10-05 DIAGNOSIS — I959 Hypotension, unspecified: Secondary | ICD-10-CM | POA: Diagnosis not present

## 2016-10-05 DIAGNOSIS — I110 Hypertensive heart disease with heart failure: Secondary | ICD-10-CM | POA: Diagnosis present

## 2016-10-05 DIAGNOSIS — R0600 Dyspnea, unspecified: Secondary | ICD-10-CM | POA: Diagnosis present

## 2016-10-05 DIAGNOSIS — Z794 Long term (current) use of insulin: Secondary | ICD-10-CM

## 2016-10-05 DIAGNOSIS — Z87891 Personal history of nicotine dependence: Secondary | ICD-10-CM

## 2016-10-05 DIAGNOSIS — N359 Urethral stricture, unspecified: Secondary | ICD-10-CM | POA: Diagnosis present

## 2016-10-05 DIAGNOSIS — I459 Conduction disorder, unspecified: Secondary | ICD-10-CM

## 2016-10-05 DIAGNOSIS — R0902 Hypoxemia: Secondary | ICD-10-CM

## 2016-10-05 DIAGNOSIS — I442 Atrioventricular block, complete: Secondary | ICD-10-CM | POA: Diagnosis present

## 2016-10-05 DIAGNOSIS — R059 Cough, unspecified: Secondary | ICD-10-CM

## 2016-10-05 DIAGNOSIS — Z950 Presence of cardiac pacemaker: Secondary | ICD-10-CM

## 2016-10-05 DIAGNOSIS — E785 Hyperlipidemia, unspecified: Secondary | ICD-10-CM | POA: Diagnosis present

## 2016-10-05 DIAGNOSIS — Z7982 Long term (current) use of aspirin: Secondary | ICD-10-CM

## 2016-10-05 DIAGNOSIS — Z6841 Body Mass Index (BMI) 40.0 and over, adult: Secondary | ICD-10-CM

## 2016-10-05 DIAGNOSIS — I10 Essential (primary) hypertension: Secondary | ICD-10-CM

## 2016-10-05 DIAGNOSIS — I5023 Acute on chronic systolic (congestive) heart failure: Secondary | ICD-10-CM | POA: Diagnosis present

## 2016-10-05 DIAGNOSIS — G4733 Obstructive sleep apnea (adult) (pediatric): Secondary | ICD-10-CM | POA: Diagnosis present

## 2016-10-05 DIAGNOSIS — I248 Other forms of acute ischemic heart disease: Secondary | ICD-10-CM

## 2016-10-05 DIAGNOSIS — M109 Gout, unspecified: Secondary | ICD-10-CM | POA: Diagnosis present

## 2016-10-05 DIAGNOSIS — R05 Cough: Secondary | ICD-10-CM

## 2016-10-05 DIAGNOSIS — E876 Hypokalemia: Secondary | ICD-10-CM | POA: Diagnosis not present

## 2016-10-05 DIAGNOSIS — I509 Heart failure, unspecified: Secondary | ICD-10-CM

## 2016-10-05 DIAGNOSIS — M1A00X0 Idiopathic chronic gout, unspecified site, without tophus (tophi): Secondary | ICD-10-CM

## 2016-10-05 DIAGNOSIS — R579 Shock, unspecified: Secondary | ICD-10-CM

## 2016-10-05 LAB — CBC
HEMATOCRIT: 39.3 % — AB (ref 40.0–52.0)
HEMATOCRIT: 35.8 % — AB (ref 40.0–52.0)
HEMOGLOBIN: 11.8 g/dL — AB (ref 13.0–18.0)
Hemoglobin: 13 g/dL (ref 13.0–18.0)
MCH: 27.9 pg (ref 26.0–34.0)
MCH: 28 pg (ref 26.0–34.0)
MCHC: 33 g/dL (ref 32.0–36.0)
MCHC: 33 g/dL (ref 32.0–36.0)
MCV: 84.6 fL (ref 80.0–100.0)
MCV: 84.7 fL (ref 80.0–100.0)
Platelets: 164 10*3/uL (ref 150–440)
Platelets: 164 10*3/uL (ref 150–440)
RBC: 4.65 MIL/uL (ref 4.40–5.90)
RBC: 4.23 MIL/uL — ABNORMAL LOW (ref 4.40–5.90)
RDW: 14.4 % (ref 11.5–14.5)
RDW: 14.2 % (ref 11.5–14.5)
WBC: 11.3 10*3/uL — ABNORMAL HIGH (ref 3.8–10.6)
WBC: 10.3 10*3/uL (ref 3.8–10.6)

## 2016-10-05 LAB — PHOSPHORUS: PHOSPHORUS: 3.6 mg/dL (ref 2.5–4.6)

## 2016-10-05 LAB — COMPREHENSIVE METABOLIC PANEL
ALK PHOS: 89 U/L (ref 38–126)
ALT: 47 U/L (ref 17–63)
ANION GAP: 7 (ref 5–15)
AST: 39 U/L (ref 15–41)
Albumin: 4.1 g/dL (ref 3.5–5.0)
BILIRUBIN TOTAL: 1.3 mg/dL — AB (ref 0.3–1.2)
BUN: 24 mg/dL — ABNORMAL HIGH (ref 6–20)
CALCIUM: 9 mg/dL (ref 8.9–10.3)
CO2: 31 mmol/L (ref 22–32)
CREATININE: 1.11 mg/dL (ref 0.61–1.24)
Chloride: 101 mmol/L (ref 101–111)
GFR calc non Af Amer: >60 mL/min (ref >60)
Glucose, Bld: 158 mg/dL — ABNORMAL HIGH (ref 65–99)
Potassium: 3.7 mmol/L (ref 3.5–5.1)
Sodium: 139 mmol/L (ref 135–145)
TOTAL PROTEIN: 7.3 g/dL (ref 6.5–8.1)

## 2016-10-05 LAB — DIGOXIN LEVEL: Digoxin Level: 0.4 ng/mL — ABNORMAL LOW (ref 0.8–2.0)

## 2016-10-05 LAB — TSH
TSH: 4.567 u[IU]/mL — ABNORMAL HIGH (ref 0.350–4.500)
TSH: 3.445 u[IU]/mL (ref 0.350–4.500)

## 2016-10-05 LAB — TROPONIN I
TROPONIN I: 0.04 ng/mL — AB (ref <0.03)
Troponin I: 0.04 ng/mL (ref <0.03)
Troponin I: 0.04 ng/mL (ref <0.03)

## 2016-10-05 LAB — GLUCOSE, CAPILLARY
GLUCOSE-CAPILLARY: 145 mg/dL — AB (ref 65–99)
Glucose-Capillary: 132 mg/dL — ABNORMAL HIGH (ref 65–99)
Glucose-Capillary: 138 mg/dL — ABNORMAL HIGH (ref 65–99)
Glucose-Capillary: 156 mg/dL — ABNORMAL HIGH (ref 65–99)

## 2016-10-05 LAB — CREATININE, SERUM: Creatinine, Ser: 1.01 mg/dL (ref 0.61–1.24)

## 2016-10-05 LAB — MAGNESIUM: Magnesium: 1.8 mg/dL (ref 1.7–2.4)

## 2016-10-05 LAB — MRSA PCR SCREENING: MRSA by PCR: NEGATIVE

## 2016-10-05 MED ORDER — FUROSEMIDE 40 MG PO TABS
80.0000 mg | ORAL_TABLET | Freq: Two times a day (BID) | ORAL | Status: DC
  Administered 2016-10-05 – 2016-10-07 (×5): 80 mg via ORAL
  Filled 2016-10-05 (×5): qty 2

## 2016-10-05 MED ORDER — ASPIRIN EC 81 MG PO TBEC
81.0000 mg | DELAYED_RELEASE_TABLET | Freq: Every day | ORAL | Status: DC
  Administered 2016-10-05 – 2016-10-08 (×4): 81 mg via ORAL
  Filled 2016-10-05 (×4): qty 1

## 2016-10-05 MED ORDER — SODIUM CHLORIDE 0.9 % IV BOLUS (SEPSIS)
250.0000 mL | Freq: Once | INTRAVENOUS | Status: AC
  Administered 2016-10-05: 250 mL via INTRAVENOUS

## 2016-10-05 MED ORDER — ONDANSETRON HCL 4 MG/2ML IJ SOLN: 4.0000 mg | Freq: Four times a day (QID) | INTRAMUSCULAR | Status: DC | PRN

## 2016-10-05 MED ORDER — ACETAMINOPHEN 325 MG PO TABS: 650.0000 mg | ORAL_TABLET | Freq: Four times a day (QID) | ORAL | Status: DC | PRN

## 2016-10-05 MED ORDER — INSULIN ASPART 100 UNIT/ML ~~LOC~~ SOLN
0.0000 [IU] | Freq: Three times a day (TID) | SUBCUTANEOUS | Status: DC
  Administered 2016-10-05: 2 [IU] via SUBCUTANEOUS
  Administered 2016-10-05 – 2016-10-07 (×5): 1 [IU] via SUBCUTANEOUS
  Administered 2016-10-07: 2 [IU] via SUBCUTANEOUS
  Administered 2016-10-08 (×2): 1 [IU] via SUBCUTANEOUS
  Filled 2016-10-05: qty 2
  Filled 2016-10-05 (×2): qty 1
  Filled 2016-10-05: qty 2
  Filled 2016-10-05 (×5): qty 1

## 2016-10-05 MED ORDER — GUAIFENESIN 100 MG/5ML PO SOLN
5.0000 mL | ORAL | Status: DC | PRN
  Administered 2016-10-05 – 2016-10-07 (×4): 100 mg via ORAL
  Filled 2016-10-05 (×7): qty 5

## 2016-10-05 MED ORDER — ONDANSETRON HCL 4 MG PO TABS: 4.0000 mg | ORAL_TABLET | Freq: Four times a day (QID) | ORAL | Status: DC | PRN

## 2016-10-05 MED ORDER — SODIUM CHLORIDE 0.9% FLUSH
3.0000 mL | Freq: Two times a day (BID) | INTRAVENOUS | Status: DC
  Administered 2016-10-05 – 2016-10-07 (×6): 3 mL via INTRAVENOUS

## 2016-10-05 MED ORDER — INSULIN ASPART PROT & ASPART (70-30 MIX) 100 UNIT/ML ~~LOC~~ SUSP
25.0000 [IU] | Freq: Every day | SUBCUTANEOUS | Status: DC
  Administered 2016-10-07 – 2016-10-08 (×2): 25 [IU] via SUBCUTANEOUS
  Filled 2016-10-05 (×2): qty 25

## 2016-10-05 MED ORDER — PERFLUTREN LIPID MICROSPHERE
1.0000 mL | INTRAVENOUS | Status: AC | PRN
  Administered 2016-10-05: 10 mL via INTRAVENOUS
  Filled 2016-10-05: qty 10

## 2016-10-05 MED ORDER — ATROPINE SULFATE 1 MG/10ML IJ SOSY
0.5000 mg | PREFILLED_SYRINGE | Freq: Once | INTRAMUSCULAR | Status: AC
  Administered 2016-10-05: 0.5 mg via INTRAVENOUS
  Filled 2016-10-05: qty 10

## 2016-10-05 MED ORDER — MENTHOL 3 MG MT LOZG
1.0000 | LOZENGE | OROMUCOSAL | Status: DC | PRN
  Administered 2016-10-05: 3 mg via ORAL
  Administered 2016-10-06: 1 via ORAL
  Filled 2016-10-05 (×2): qty 9

## 2016-10-05 MED ORDER — INSULIN ASPART 100 UNIT/ML ~~LOC~~ SOLN: 0.0000 [IU] | Freq: Every day | SUBCUTANEOUS | Status: DC

## 2016-10-05 MED ORDER — HYDROCODONE-ACETAMINOPHEN 5-325 MG PO TABS
1.0000 | ORAL_TABLET | ORAL | Status: DC | PRN
  Administered 2016-10-05 – 2016-10-07 (×3): 1 via ORAL
  Filled 2016-10-05 (×3): qty 1

## 2016-10-05 MED ORDER — ACETAMINOPHEN 650 MG RE SUPP: 650.0000 mg | Freq: Four times a day (QID) | RECTAL | Status: DC | PRN

## 2016-10-05 MED ORDER — SENNOSIDES-DOCUSATE SODIUM 8.6-50 MG PO TABS: 1.0000 | ORAL_TABLET | Freq: Every evening | ORAL | Status: DC | PRN

## 2016-10-05 MED ORDER — ENOXAPARIN SODIUM 40 MG/0.4ML ~~LOC~~ SOLN
40.0000 mg | SUBCUTANEOUS | Status: DC
  Administered 2016-10-05 – 2016-10-06 (×2): 40 mg via SUBCUTANEOUS
  Filled 2016-10-05 (×2): qty 0.4

## 2016-10-05 MED ORDER — ALLOPURINOL 300 MG PO TABS
300.0000 mg | ORAL_TABLET | Freq: Two times a day (BID) | ORAL | Status: DC
  Administered 2016-10-05 – 2016-10-08 (×7): 300 mg via ORAL
  Filled 2016-10-05: qty 3
  Filled 2016-10-05: qty 1
  Filled 2016-10-05: qty 3
  Filled 2016-10-05 (×7): qty 1

## 2016-10-05 MED ORDER — ATORVASTATIN CALCIUM 20 MG PO TABS
80.0000 mg | ORAL_TABLET | Freq: Every day | ORAL | Status: DC
  Administered 2016-10-05 – 2016-10-07 (×3): 80 mg via ORAL
  Filled 2016-10-05 (×3): qty 4

## 2016-10-05 MED ORDER — TAMSULOSIN HCL 0.4 MG PO CAPS
0.4000 mg | ORAL_CAPSULE | Freq: Every day | ORAL | Status: DC
  Administered 2016-10-05 – 2016-10-08 (×4): 0.4 mg via ORAL
  Filled 2016-10-05 (×4): qty 1

## 2016-10-05 NOTE — ED Notes (Signed)
Pt placed on 2lpm via [place] oxygen. Dr. Zenda AlpersWebster in to speak with pt regarding treatment plan.

## 2016-10-05 NOTE — ED Notes (Signed)
Pt readjusted in bed for comfort. No change on ekg noted.

## 2016-10-05 NOTE — Progress Notes (Signed)
*  PRELIMINARY RESULTS* Echocardiogram 2D Echocardiogram has been performed. Definity Contrast requested & used on this study.  Edward Myers 10/05/2016, 1:43 PM

## 2016-10-05 NOTE — ED Triage Notes (Signed)
Pt and friend state pt with shob since Thursday that is increasing. Pt with bilateral lower extremity edema noted. Pt states he has felt like he had had a fever and chills. Pt with dyspnea noted on exertion, but pt is able to speak in full sentences.

## 2016-10-05 NOTE — ED Notes (Addendum)
Pt on defib pads. Crash cart at bedside.

## 2016-10-05 NOTE — Progress Notes (Signed)
RN spoke with Dr. Juliene PinaMody and made her aware that Patient complains of cough and sore throat and asking for medicine.  MD gave order for throat lozenges and robitussin PRN.

## 2016-10-05 NOTE — Consult Note (Signed)
[facility]  CARDIOLOGY CONSULT NOTE  Patient ID: Edward Myers MRN: [id number] DOB/AGE: 1956-01-09 61 y.o.  Admit date: 10/05/2016 Referring Physician Dr. Juliene PinaMody Primary Physician Dr. Graciela HusbandsKlein Primary Cardiologist Dr. Juliann Paresallwood Reason for Consultation [facility]  HPI: Pt is a 61 yo male with history of chronic systolic heart failure with ef of 30-35%, obesity, diabetes, hypertension who has been treated with carvedilol 6.25 mg bid, digoxin 0.25 mg daily. Over the past several weeks, has felt increased fatigue and sob. He saw his primary physician last week and pulse was reported to be 88. Echo was ordered. Pt denis syncoope or presyncope. He denies chest pain. Electrolytes were OK. CXR revealed cardiomegaly with no effusion or pulmonary edema. He is hemodynamically stable. He had sinus tahycardia with admission last year.   Review of Systems  Constitutional: Positive for malaise/fatigue.  HENT: Negative.   Eyes: Negative.   Respiratory: Positive for shortness of breath.   Cardiovascular: Negative.   Gastrointestinal: Negative.   Genitourinary: Negative.   Musculoskeletal: Negative.   Skin: Negative.   Neurological: Negative.   Endo/Heme/Allergies: Negative.   Psychiatric/Behavioral: Negative.     Past Medical History:  Diagnosis Date  . Chronic gouty arthropathy   . Diabetes mellitus without complication (HCC)   . Glaucoma   . Hyperlipidemia   . Hypertension   . Morbid obesity (HCC)   . Rhinitis medicamentosa   . Urethral stricture    scheduled for repair May 2017  . UTI (urinary tract infection)     Family History  Problem Relation Age of Onset  . Heart attack Mother   . Diabetes Mother   . Congestive Heart Failure Mother     Social History   Social History  . Marital status: Married    Spouse name: N/A  . Number of children: N/A  . Years of education: N/A   Occupational History  . Not on file.   Social History Main Topics   . Smoking status: Former Smoker    Types: Cigars    Quit date: 01/25/2010  . Smokeless tobacco: Never Used  . Alcohol use Yes  . Drug use: No  . Sexual activity: Not on file   Other Topics Concern  . Not on file   Social History Narrative  . No narrative on file    Past Surgical History:  Procedure Laterality Date  . CIRCUMCISION    . TONSILLECTOMY       Prescriptions Prior to Admission  Medication Sig Dispense Refill Last Dose  . allopurinol (ZYLOPRIM) 300 MG tablet Take 300 mg by mouth 2 (two) times daily.   10/04/2016 at Unknown time  . aspirin EC 81 MG tablet Take 81 mg by mouth daily.   10/04/2016 at Unknown time  . atorvastatin (LIPITOR) 80 MG tablet Take 80 mg by mouth daily.   10/04/2016 at Unknown time  . carvedilol (COREG) 6.25 MG tablet Take 6.25 mg by mouth 2 (two) times daily with a meal.   10/04/2016 at 2100  . digoxin (LANOXIN) 0.125 MG tablet Take 0.125 mg by mouth daily.   10/04/2016 at Unknown time  . furosemide (LASIX) 80 MG tablet Take 80 mg by mouth 2 (two) times daily.   10/04/2016 at Unknown time  . insulin aspart protamine - aspart (NOVOLOG MIX 70/30 FLEXPEN) (70-30) 100 UNIT/ML FlexPen Inject 25 Units into the skin daily with breakfast.   10/04/2016 at Unknown time  . lisinopril (PRINIVIL,ZESTRIL) 10 MG tablet Take 10 mg  by mouth 2 (two) times daily.   10/04/2016 at Unknown time  . metFORMIN (GLUCOPHAGE) 1000 MG tablet Take 1,000 mg by mouth 2 (two) times daily with a meal.   10/04/2016 at Unknown time  . tamsulosin (FLOMAX) 0.4 MG CAPS capsule Take 0.4 mg by mouth.   10/04/2016 at Unknown time    Physical Exam: Blood pressure 106/64, pulse (!) 40, temperature 97.8 F (36.6 C), temperature source Oral, resp. rate 12, height 6' (1.829 m), weight (!) 146.4 kg (322 lb 12.1 oz), SpO2 96 %.   Wt Readings from Last 1 Encounters:  10/05/16 (!) 146.4 kg (322 lb 12.1 oz)     General appearance: alert and cooperative Resp: clear to auscultation bilaterally Cardio:  regular rate and rhythm GI: soft, non-tender; bowel sounds normal; no masses,  no organomegaly Extremities: edema 1-2+ edema in lower extremeties.  Neurologic: Grossly normal  Labs:   Lab Results  Component Value Date   WBC 10.3 10/05/2016   HGB 11.8 (L) 10/05/2016   HCT 35.8 (L) 10/05/2016   MCV 84.7 10/05/2016   PLT 164 10/05/2016    Recent Labs Lab 10/05/16 0543 10/05/16 0902  NA 139  --   K 3.7  --   CL 101  --   CO2 31  --   BUN 24*  --   CREATININE 1.11 1.01  CALCIUM 9.0  --   PROT 7.3  --   BILITOT 1.3*  --   ALKPHOS 89  --   ALT 47  --   AST 39  --   GLUCOSE 158*  --    Lab Results  Component Value Date   TROPONINI 0.04 (HH) 10/05/2016     EKG: sinus rhythm with av dissociation with ventricular escape at 39-44. 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 Noted to be in chb with ventricular escape of approximately 40 bpm. Hemodynamically stable. No syncope or presyncope. Will stop carvedilol and digoxin. Dig level was 0.4. Will follow heart rate and hemodynamics. If heart rate does not improve in the next 24 hours, will consider PPM with dual chamber pacer. Will get echo today to evaluate lv function to guide decision regarding pacing modality.  Signed: Dalia Heading MD, [facility] 10/05/2016, 10:31 AM

## 2016-10-05 NOTE — ED Notes (Signed)
Report to kim, rn.  

## 2016-10-05 NOTE — ED Provider Notes (Signed)
[facility] Emergency Department Provider Note   ____________________________________________   First MD Initiated Contact with Patient 10/05/16 0542     (approximate)  I have reviewed the triage vital signs and the nursing notes.   HISTORY  Chief Complaint Shortness of Breath    HPI Edward Myers is a 61 y.o. male who comes into the hospital today with some extreme shortness of breath. The patient reports that it occurs every couple of minutes. He reports it is been going on for a couple of weeks but it seemed worse tonight. He reports that he's had this issue before. He saw his cardiologist Dr. call would and had his medications changed. Approximate 3 months ago his carvedilol was increased any other change in his lisinopril dose. The patient also saw his primary care physician on Thursday. He had a chest x-ray and they ordered an echocardiogram for April 12. He also has a stress test scheduled. He denies any chest pain and he does not feel like someone is sitting on his chest. He reports that he's had a cough and some chills. He is also felt a little cold. The patient was concerned about this shortness of breath so he decided to come into the hospital. He reports that his leg swelling is improved from previous. He is here for evaluation.   Past Medical History:  Diagnosis Date  . Chronic gouty arthropathy   . Diabetes mellitus without complication (HCC)   . Glaucoma   . Hyperlipidemia   . Hypertension   . Morbid obesity (HCC)   . Rhinitis medicamentosa   . Urethral stricture    scheduled for repair May 2017  . UTI (urinary tract infection)     Patient Active Problem List   Diagnosis Date Noted  . Acute congestive heart failure (HCC) 12/01/2015  . Demand ischemia (HCC) 07/26/2015  . Shock (HCC) 07/25/2015  . Hyperlipidemia   . Hypertension   . Chronic gouty arthropathy   . Diabetes mellitus with hyperglycemia [facility](HCC)     Past Surgical  History:  Procedure Laterality Date  . CIRCUMCISION    . TONSILLECTOMY      Prior to Admission medications   Medication Sig Start Date End Date Taking? Authorizing Provider  allopurinol (ZYLOPRIM) 300 MG tablet Take 300 mg by mouth 2 (two) times daily.   Yes Historical Provider, MD  aspirin EC 81 MG tablet Take 81 mg by mouth daily.   Yes Historical Provider, MD  atorvastatin (LIPITOR) 80 MG tablet Take 80 mg by mouth daily.   Yes Historical Provider, MD  carvedilol (COREG) 6.25 MG tablet Take 6.25 mg by mouth 2 (two) times daily with a meal.   Yes Historical Provider, MD  digoxin (LANOXIN) 0.125 MG tablet Take 0.125 mg by mouth daily.   Yes Historical Provider, MD  furosemide (LASIX) 80 MG tablet Take 80 mg by mouth 2 (two) times daily.   Yes Historical Provider, MD  insulin aspart protamine - aspart (NOVOLOG MIX 70/30 FLEXPEN) (70-30) 100 UNIT/ML FlexPen Inject 25 Units into the skin daily with breakfast.   Yes Historical Provider, MD  lisinopril (PRINIVIL,ZESTRIL) 10 MG tablet Take 10 mg by mouth 2 (two) times daily.   Yes Historical Provider, MD  metFORMIN (GLUCOPHAGE) 1000 MG tablet Take 1,000 mg by mouth 2 (two) times daily with a meal.   Yes Historical Provider, MD  tamsulosin (FLOMAX) 0.4 MG CAPS capsule Take 0.4 mg by mouth.   Yes Historical Provider, MD  Allergies Patient has no known allergies.  Family History  Problem Relation Age of Onset  . Heart attack Mother   . Diabetes Mother   . Congestive Heart Failure Mother     Social History Social History  Substance Use Topics  . Smoking status: Former Smoker    Types: Cigars    Quit date: 01/25/2010  . Smokeless tobacco: Never Used  . Alcohol use Yes    Review of Systems Constitutional: No fever/chills Eyes: No visual changes. ENT: No sore throat. Cardiovascular: Denies chest pain. Respiratory:  shortness of breath. Gastrointestinal: No abdominal pain.  No nausea, no vomiting.  No diarrhea.  No  constipation. Genitourinary: Negative for dysuria. Musculoskeletal: Negative for back pain. Skin: Negative for rash. Neurological: Negative for headaches, focal weakness or numbness.  10-point ROS otherwise negative.  ____________________________________________   PHYSICAL EXAM:  VITAL SIGNS: ED Triage Vitals  Enc Vitals Group     BP 10/05/16 0543 (!) 91/56     Pulse Rate 10/05/16 0535 (!) 41     Resp 10/05/16 0535 (!) 26     Temp 10/05/16 0535 97.7 F (36.5 C)     Temp Source 10/05/16 0535 Oral     SpO2 10/05/16 0535 98 %     Weight 10/05/16 0539 (!) 314 lb (142.4 kg)     Height 10/05/16 0539 6' (1.829 m)     Head Circumference --      Peak Flow --      Pain Score --      Pain Loc --      Pain Edu? --      Excl. in GC? --     Constitutional: Alert and oriented. Well appearing and in moderate distress. Eyes: Conjunctivae are normal. PERRL. EOMI. Head: Atraumatic. Nose: No congestion/rhinnorhea. Mouth/Throat: Mucous membranes are moist.  Oropharynx non-erythematous. Cardiovascular: Bradycardia, regular rhythm. Grossly normal heart sounds.  Good peripheral circulation. Respiratory: Normal respiratory effort.  No retractions. Lungs CTAB. Gastrointestinal: Soft and nontender. No distention. Positive bowel sounds Musculoskeletal: lower extremity trace edema.   Neurologic:  Normal speech and language.  Skin:  Skin is warm, dry and intact.  Psychiatric: Mood and affect are normal.   ____________________________________________   LABS (all labs ordered are listed, but only abnormal results are displayed)  Labs Reviewed  CBC - Abnormal; Notable for the following:       Result Value   WBC 11.3 (*)    HCT 39.3 (*)    All other components within normal limits  TROPONIN I - Abnormal; Notable for the following:    Troponin I 0.04 (*)    All other components within normal limits  COMPREHENSIVE METABOLIC PANEL - Abnormal; Notable for the following:    Glucose, Bld 158 (*)     BUN 24 (*)    Total Bilirubin 1.3 (*)    All other components within normal limits  TSH - Abnormal; Notable for the following:    TSH 4.567 (*)    All other components within normal limits  DIGOXIN LEVEL - Abnormal; Notable for the following:    Digoxin Level 0.4 (*)    All other components within normal limits  MAGNESIUM  PHOSPHORUS   ____________________________________________  EKG  ED ECG REPORT I, Rebecka Apley, the attending physician, personally viewed and interpreted this ECG.   Date: 10/05/2016  EKG Time: 551  Rate: 42  Rhythm: sinus bradycardia  Axis: left axis deviation  Intervals:right bundle branch block, left anterior fascicular block  and wide qrs  ST&T Change: flipped t waves in lead, I, avl  ____________________________________________  RADIOLOGY  CXR ____________________________________________   PROCEDURES  Procedure(s) performed: None  Procedures  Critical Care performed: No  ____________________________________________   INITIAL IMPRESSION / ASSESSMENT AND PLAN / ED COURSE  Pertinent labs & imaging results that were available during my care of the patient were reviewed by me and considered in my medical decision making (see chart for details).  This is a 61 year old male who comes into the hospital today with some shortness of breath. The patient received an EKG which showed some complete heart block. His heart rate was in the 40s. I did give the patient some atropine which did not help. I also contacted the cardiologist Dr. Ihor Austin. The patient's blood work had not yet returned but as the patient is on digoxin and carvedilol he feels that we should admit the patient and they will evaluate the patient in the morning. He reports that if he does need a temporary pacer or a permanent pacemaker that can be placed here at [facility] regional. He reports that the patient should not receive his carvedilol nor his digoxin and he does not recommend  any other medications for the patient at this time.  Clinical Course as of Oct 06 726  Sun Oct 05, 2016  0652 1. Stable moderate cardiomegaly. 2. Right lung base nodular and reticular density may represent chronic changes or pneumonia. Clinical correlation is recommended.   DG Chest Portable 1 View [AW]    Clinical Course User Index [AW] Rebecka Apley, MD    I contacted the admitting doctors. The patient's at [facility] level is low. His TSH is also a little elevated. He will be admitted to the hospitalist service for further evaluation and treatment of his symptoms. He did receive a 250 mL bolus of normal saline for a moderately low blood pressure of 86/68. ____________________________________________   FINAL CLINICAL IMPRESSION(S) / ED DIAGNOSES  Final diagnoses:  Complete heart block (HCC)  Dyspnea, unspecified type      NEW MEDICATIONS STARTED DURING THIS VISIT:  New Prescriptions   No medications on file     Note:  This document was prepared using Dragon voice recognition software and may include unintentional dictation errors.    Rebecka Apley, MD 10/05/16 [phone]

## 2016-10-05 NOTE — H&P (Signed)
[facility] - [place] at [facility]   PATIENT NAME: Edward Myers    MR#:  [id number]  DATE OF BIRTH:  17-Jan-1956  DATE OF ADMISSION:  10/05/2016  PRIMARY CARE PHYSICIAN: Curtis Sites III, MD   REQUESTING/REFERRING PHYSICIAN: dr Zenda Alpers  CHIEF COMPLAINT:   SOB HISTORY OF PRESENT ILLNESS:  Cutter Passey  is a 61 y.o. male with a known history of Chronic systolic heart failure ejection fraction 30-35%, morbid obesity, diabetes and essential hypertension who presents with above complaint. Patient reports increasing shortness of breath over the past few weeks. He has chronic lower extremity edema which actually has improved over the past week. He does have some abdominal distention. In the emergency room he was found to have complete heart block. He was given atropine. Cardiology was consulted by the ED physician.  PAST MEDICAL HISTORY:   Past Medical History:  Diagnosis Date  . Chronic gouty arthropathy   . Diabetes mellitus without complication (HCC)   . Glaucoma   . Hyperlipidemia   . Hypertension   . Morbid obesity (HCC)   . Rhinitis medicamentosa   . Urethral stricture    scheduled for repair May 2017  . UTI (urinary tract infection)     PAST SURGICAL HISTORY:   Past Surgical History:  Procedure Laterality Date  . CIRCUMCISION    . TONSILLECTOMY      SOCIAL HISTORY:   Social History  Substance Use Topics  . Smoking status: Former Smoker    Types: Cigars    Quit date: 01/25/2010  . Smokeless tobacco: Never Used  . Alcohol use Yes    FAMILY HISTORY:   Family History  Problem Relation Age of Onset  . Heart attack Mother   . Diabetes Mother   . Congestive Heart Failure Mother     DRUG ALLERGIES:  No Known Allergies  REVIEW OF SYSTEMS:   Review of Systems  Constitutional: Negative.  Negative for chills, fever and malaise/fatigue.  HENT: Negative.  Negative for ear discharge, ear pain, hearing loss, nosebleeds and sore throat.   Eyes:  Negative.  Negative for blurred vision and pain.  Respiratory: Positive for shortness of breath. Negative for cough, hemoptysis and wheezing.   Cardiovascular: Positive for leg swelling. Negative for chest pain and palpitations.  Gastrointestinal: Negative.  Negative for abdominal pain, blood in stool, diarrhea, nausea and vomiting.  Genitourinary: Negative.  Negative for dysuria.  Musculoskeletal: Negative.  Negative for back pain.  Skin: Negative.   Neurological: Negative for dizziness, tremors, speech change, focal weakness, seizures and headaches.  Endo/Heme/Allergies: Negative.  Does not bruise/bleed easily.  Psychiatric/Behavioral: Negative.  Negative for depression, hallucinations and suicidal ideas.    MEDICATIONS AT HOME:   Prior to Admission medications   Medication Sig Start Date End Date Taking? Authorizing Provider  allopurinol (ZYLOPRIM) 300 MG tablet Take 300 mg by mouth 2 (two) times daily.   Yes Historical Provider, MD  aspirin EC 81 MG tablet Take 81 mg by mouth daily.   Yes Historical Provider, MD  atorvastatin (LIPITOR) 80 MG tablet Take 80 mg by mouth daily.   Yes Historical Provider, MD  carvedilol (COREG) 6.25 MG tablet Take 6.25 mg by mouth 2 (two) times daily with a meal.   Yes Historical Provider, MD  digoxin (LANOXIN) 0.125 MG tablet Take 0.125 mg by mouth daily.   Yes Historical Provider, MD  furosemide (LASIX) 80 MG tablet Take 80 mg by mouth 2 (two) times daily.   Yes Historical Provider,  MD  insulin aspart protamine - aspart (NOVOLOG MIX 70/30 FLEXPEN) (70-30) 100 UNIT/ML FlexPen Inject 25 Units into the skin daily with breakfast.   Yes Historical Provider, MD  lisinopril (PRINIVIL,ZESTRIL) 10 MG tablet Take 10 mg by mouth 2 (two) times daily.   Yes Historical Provider, MD  metFORMIN (GLUCOPHAGE) 1000 MG tablet Take 1,000 mg by mouth 2 (two) times daily with a meal.   Yes Historical Provider, MD  tamsulosin (FLOMAX) 0.4 MG CAPS capsule Take 0.4 mg by mouth.    Yes Historical Provider, MD      VITAL SIGNS:  Blood pressure 103/66, pulse (!) 40, temperature 97.7 F (36.5 C), temperature source Oral, resp. rate (!) 27, height 6' (1.829 m), weight (!) 142.4 kg (314 lb), SpO2 90 %.  PHYSICAL EXAMINATION:   Physical Exam  Constitutional: He is oriented to person, place, and time and well-developed, well-nourished, and in no distress. No distress.  HENT:  Head: Normocephalic.  Eyes: No scleral icterus.  Neck: Normal range of motion. Neck supple. No JVD present. No tracheal deviation present.  Cardiovascular: Normal rate, regular rhythm and normal heart sounds.  Exam reveals no gallop and no friction rub.   No murmur heard. Pulmonary/Chest: Effort normal and breath sounds normal. No respiratory distress. He has no wheezes. He has no rales. He exhibits no tenderness.  Abdominal: Soft. Bowel sounds are normal. He exhibits distension. He exhibits no mass. There is no tenderness. There is no rebound and no guarding.  Musculoskeletal: Normal range of motion. He exhibits edema.  Neurological: He is alert and oriented to person, place, and time.  Skin: Skin is warm. No rash noted. No erythema.  Psychiatric: Affect and judgment normal.      LABORATORY PANEL:   CBC  Recent Labs Lab 10/05/16 0543  WBC 11.3*  HGB 13.0  HCT 39.3*  PLT 164   ------------------------------------------------------------------------------------------------------------------  Chemistries   Recent Labs Lab 10/05/16 0543  NA 139  K 3.7  CL 101  CO2 31  GLUCOSE 158*  BUN 24*  CREATININE 1.11  CALCIUM 9.0  MG 1.8  AST 39  ALT 47  ALKPHOS 89  BILITOT 1.3*   ------------------------------------------------------------------------------------------------------------------  Cardiac Enzymes  Recent Labs Lab 10/05/16 0543  TROPONINI 0.04*    ------------------------------------------------------------------------------------------------------------------  RADIOLOGY:  Dg Chest Portable 1 View  Result Date: 10/05/2016 CLINICAL DATA:  61 year old male with shortness of breath and bradycardia. EXAM: PORTABLE CHEST 1 VIEW COMPARISON:  Chest radiograph dated 12/01/2015 FINDINGS: There is stable moderate cardiomegaly. An area of nodular and reticular density in the right mid to lower lung field may represent chronic changes. Pneumonia is not excluded. Clinical correlation is recommended. No focal consolidation, pleural effusion, or pneumothorax. No acute osseous pathology. IMPRESSION: 1. Stable moderate cardiomegaly. 2. Right lung base nodular and reticular density may represent chronic changes or pneumonia. Clinical correlation is recommended. Electronically Signed   By: Elgie Collard M.D.   On: 10/05/2016 06:30    EKG:   Sinus bradycardia heart rate of 42 with right bundle-branch block and left anterior fascicular block with wide QRS  IMPRESSION AND PLAN:   61 year old male with a history of OSA, chronic systolic heart failure ejection fraction 30-35% and diabetes who presents with shortness of breath and found to have complete heart block.  1. Complete heart block, possibly etiology shortness of breath: Admit stepdown Continue ZOLL PAD Stop digitoxin and Coreg (recently increased 3 months ago) Cardiology consultation May need pacemaker if discontinuation of above medications does not  improve heart rate  2. Chronic systolic heart failure ejection fraction 30-35%: Chest x-ray not consistent with CHF Patient reports lower extremity edema has improved Continue oral Lasix Holding beta blocker due to problem #1 as well as hypotension and holding ACE inhibitor due to hypotension  3. History of hypertension with hypotension possibly from bradycardia: Hold hypertensive medications Normal saline at 50 cc an hour for 8 hours then  discontinue  4. OSA: Order CPAP at night Patient has CPAP ordered and apparently will be delivered to his house and  5. Morbid obesity: Patient is encouraged to lose weight  6. Diabetes: Sliding scale insulin with ADA diet Continue 70/30 Hold metformin 7. BPH: Continue Flomax  8. Hyperlipidemia: Continue Lipitor       All the records are reviewed and case discussed with ED provider. Management plans discussed with the patient and he is in agreement  CODE STATUS: full  Critical care  TOTAL TIME TAKING CARE OF THIS PATIENT: 50 minutes.    Aksh Swart M.D on 10/05/2016 at 7:40 AM  Between 7am to 6pm - Pager - [phone]  After 6pm go to [email or web address] - Social research officer, [email or web address] [email or web address]  [facility]  Office  [phone]  CC: Primary care physician; Lynnea FerrierBERT J KLEIN III, MD

## 2016-10-05 NOTE — ED Notes (Signed)
Pt updated on results of dig level. Pt denies needs. Pt alert and oriented x4.

## 2016-10-06 ENCOUNTER — Encounter
Admission: EM | Disposition: A | Discharge status: Home / Self Care | Payer: Self-pay | Source: Home / Self Care | Attending: Internal Medicine

## 2016-10-06 ENCOUNTER — Inpatient Hospital Stay: Payer: BLUE CROSS/BLUE SHIELD

## 2016-10-06 ENCOUNTER — Inpatient Hospital Stay: Payer: BLUE CROSS/BLUE SHIELD | Admitting: Anesthesiology

## 2016-10-06 HISTORY — PX: PACEMAKER INSERTION: SHX728

## 2016-10-06 LAB — BASIC METABOLIC PANEL
Anion gap: 9 (ref 5–15)
BUN: 24 mg/dL — ABNORMAL HIGH (ref 6–20)
CHLORIDE: 101 mmol/L (ref 101–111)
CO2: 29 mmol/L (ref 22–32)
Calcium: 8.7 mg/dL — ABNORMAL LOW (ref 8.9–10.3)
Creatinine, Ser: 0.85 mg/dL (ref 0.61–1.24)
GFR calc non Af Amer: >60 mL/min (ref >60)
Glucose, Bld: 142 mg/dL — ABNORMAL HIGH (ref 65–99)
POTASSIUM: 3.1 mmol/L — AB (ref 3.5–5.1)
Sodium: 139 mmol/L (ref 135–145)

## 2016-10-06 LAB — CBC
HEMATOCRIT: 36.8 % — AB (ref 40.0–52.0)
Hemoglobin: 12.3 g/dL — ABNORMAL LOW (ref 13.0–18.0)
MCH: 27.9 pg (ref 26.0–34.0)
MCHC: 33.4 g/dL (ref 32.0–36.0)
MCV: 83.6 fL (ref 80.0–100.0)
Platelets: 166 10*3/uL (ref 150–440)
RBC: 4.4 MIL/uL (ref 4.40–5.90)
RDW: 14.2 % (ref 11.5–14.5)
WBC: 9.9 10*3/uL (ref 3.8–10.6)

## 2016-10-06 LAB — GLUCOSE, CAPILLARY
GLUCOSE-CAPILLARY: 138 mg/dL — AB (ref 65–99)
GLUCOSE-CAPILLARY: 97 mg/dL (ref 65–99)
Glucose-Capillary: 137 mg/dL — ABNORMAL HIGH (ref 65–99)
Glucose-Capillary: 144 mg/dL — ABNORMAL HIGH (ref 65–99)
Glucose-Capillary: 113 mg/dL — ABNORMAL HIGH (ref 65–99)

## 2016-10-06 LAB — ECHOCARDIOGRAM COMPLETE
Height: 72 in
Weight: 5164.06 oz

## 2016-10-06 LAB — MAGNESIUM: Magnesium: 1.7 mg/dL (ref 1.7–2.4)

## 2016-10-06 LAB — TROPONIN I: Troponin I: 0.04 ng/mL (ref <0.03)

## 2016-10-06 SURGERY — INSERTION, CARDIAC PACEMAKER
Anesthesia: Monitor Anesthesia Care | Laterality: Left | Wound class: Clean

## 2016-10-06 MED ORDER — SODIUM CHLORIDE 0.9 % IV SOLN
INTRAVENOUS | Status: DC
  Administered 2016-10-06: 12:00:00 via INTRAVENOUS

## 2016-10-06 MED ORDER — DIPHENHYDRAMINE HCL 25 MG PO CAPS
25.0000 mg | ORAL_CAPSULE | Freq: Every evening | ORAL | Status: DC | PRN
  Administered 2016-10-06 (×2): 25 mg via ORAL
  Filled 2016-10-06 (×2): qty 1

## 2016-10-06 MED ORDER — PROPOFOL 10 MG/ML IV BOLUS
INTRAVENOUS | Status: AC
  Filled 2016-10-06: qty 20

## 2016-10-06 MED ORDER — POTASSIUM CHLORIDE CRYS ER 20 MEQ PO TBCR
20.0000 meq | EXTENDED_RELEASE_TABLET | Freq: Three times a day (TID) | ORAL | Status: DC
  Administered 2016-10-06 – 2016-10-08 (×6): 20 meq via ORAL
  Filled 2016-10-06 (×8): qty 1

## 2016-10-06 MED ORDER — CEFAZOLIN IN D5W 1 GM/50ML IV SOLN
1.0000 g | Freq: Four times a day (QID) | INTRAVENOUS | Status: AC
  Administered 2016-10-06 – 2016-10-07 (×3): 1 g via INTRAVENOUS
  Filled 2016-10-06 (×3): qty 50

## 2016-10-06 MED ORDER — PROPOFOL 10 MG/ML IV BOLUS
INTRAVENOUS | Status: DC | PRN
  Administered 2016-10-06: 50 mg via INTRAVENOUS

## 2016-10-06 MED ORDER — ACETAMINOPHEN 325 MG PO TABS: 325.0000 mg | ORAL_TABLET | ORAL | Status: DC | PRN

## 2016-10-06 MED ORDER — LIDOCAINE 1 % OPTIME INJ - NO CHARGE
INTRAMUSCULAR | Status: DC | PRN
  Administered 2016-10-06: 30 mL

## 2016-10-06 MED ORDER — FUROSEMIDE 10 MG/ML IJ SOLN
40.0000 mg | Freq: Once | INTRAMUSCULAR | Status: AC
  Administered 2016-10-06: 40 mg via INTRAVENOUS
  Filled 2016-10-06: qty 4

## 2016-10-06 MED ORDER — CHLORHEXIDINE GLUCONATE 4 % EX LIQD
60.0000 mL | Freq: Once | CUTANEOUS | Status: AC
  Administered 2016-10-06: 4 via TOPICAL

## 2016-10-06 MED ORDER — ONDANSETRON HCL 4 MG/2ML IJ SOLN: 4.0000 mg | Freq: Once | INTRAMUSCULAR | Status: DC | PRN

## 2016-10-06 MED ORDER — MIDAZOLAM HCL 2 MG/2ML IJ SOLN
INTRAMUSCULAR | Status: AC
  Filled 2016-10-06: qty 2

## 2016-10-06 MED ORDER — SODIUM CHLORIDE 0.9 % IR SOLN
80.0000 mg | Status: DC
  Filled 2016-10-06: qty 2

## 2016-10-06 MED ORDER — SODIUM CHLORIDE 0.9 % IR SOLN
Status: DC | PRN
  Administered 2016-10-06: 500 mL

## 2016-10-06 MED ORDER — FENTANYL CITRATE (PF) 100 MCG/2ML IJ SOLN: 25.0000 ug | INTRAMUSCULAR | Status: DC | PRN

## 2016-10-06 MED ORDER — DEXTROSE 5 % IV SOLN
3.0000 g | INTRAVENOUS | Status: AC
  Administered 2016-10-06: 3 g via INTRAVENOUS
  Filled 2016-10-06: qty 3000

## 2016-10-06 MED ORDER — ONDANSETRON HCL 4 MG/2ML IJ SOLN: 4.0000 mg | Freq: Four times a day (QID) | INTRAMUSCULAR | Status: DC | PRN

## 2016-10-06 MED ORDER — PROPOFOL 500 MG/50ML IV EMUL
INTRAVENOUS | Status: DC | PRN
  Administered 2016-10-06: 100 ug/kg/min via INTRAVENOUS

## 2016-10-06 MED ORDER — MIDAZOLAM HCL 2 MG/2ML IJ SOLN
INTRAMUSCULAR | Status: DC | PRN
  Administered 2016-10-06: 2 mg via INTRAVENOUS

## 2016-10-06 MED ORDER — PROPOFOL 500 MG/50ML IV EMUL
INTRAVENOUS | Status: AC
  Filled 2016-10-06: qty 50

## 2016-10-06 SURGICAL SUPPLY — 41 items
BAG DECANTER FOR FLEXI CONT (MISCELLANEOUS) ×3 IMPLANT
BLADE PHOTON ILLUMINATED (MISCELLANEOUS) IMPLANT
BRUSH SCRUB 4% CHG (MISCELLANEOUS) ×3 IMPLANT
CABLE SURG 12 DISP A/V CHANNEL (MISCELLANEOUS) ×6 IMPLANT
CANISTER SUCT 1200ML W/VALVE (MISCELLANEOUS) ×3 IMPLANT
CHLORAPREP W/TINT 26ML (MISCELLANEOUS) ×3 IMPLANT
COVER LIGHT HANDLE STERIS (MISCELLANEOUS) ×6 IMPLANT
COVER MAYO STAND STRL (DRAPES) ×3 IMPLANT
DRAPE C-ARM XRAY 36X54 (DRAPES) ×3 IMPLANT
DRESSING SURGICEL FIBRLLR 1X2 (HEMOSTASIS) ×1 IMPLANT
DRSG SURGICEL FIBRILLAR 1X2 (HEMOSTASIS) ×3
DRSG TEGADERM 4X4.75 (GAUZE/BANDAGES/DRESSINGS) ×3 IMPLANT
DRSG TELFA 4X3 1S NADH ST (GAUZE/BANDAGES/DRESSINGS) ×2 IMPLANT
ELECT REM PT RETURN 9FT ADLT (ELECTROSURGICAL) ×3
ELECTRODE REM PT RTRN 9FT ADLT (ELECTROSURGICAL) ×1 IMPLANT
GAUZE SPONGE 4X4 12PLY STRL (GAUZE/BANDAGES/DRESSINGS) ×3 IMPLANT
GLOVE BIO SURGEON STRL SZ7.5 (GLOVE) ×3 IMPLANT
GLOVE BIO SURGEON STRL SZ8 (GLOVE) ×3 IMPLANT
GOWN STRL REUS W/ TWL LRG LVL3 (GOWN DISPOSABLE) ×1 IMPLANT
GOWN STRL REUS W/ TWL XL LVL3 (GOWN DISPOSABLE) ×1 IMPLANT
GOWN STRL REUS W/TWL LRG LVL3 (GOWN DISPOSABLE) ×2
GOWN STRL REUS W/TWL XL LVL3 (GOWN DISPOSABLE) ×2
IMMOBILIZER SHDR MD LX WHT (SOFTGOODS) IMPLANT
IMMOBILIZER SHDR XL LX WHT (SOFTGOODS) IMPLANT
INTRO PACEMAKR LEAD 9FR 13CM (INTRODUCER) ×3
INTRO PACEMKR SHEATH II 7FR (MISCELLANEOUS) ×6
INTRODUCER PACEMKR LD 9FR 13CM (INTRODUCER) ×1 IMPLANT
INTRODUCER PACEMKR SHTH II 7FR (MISCELLANEOUS) ×2 IMPLANT
IPG PACE AZUR XT DR MRI W1DR01 (Pacemaker) ×1 IMPLANT
IV NS 500ML (IV SOLUTION) ×2
IV NS 500ML BAXH (IV SOLUTION) ×1 IMPLANT
KIT RM TURNOVER STRD PROC AR (KITS) ×3 IMPLANT
LABEL OR SOLS (LABEL) ×3 IMPLANT
LEAD CAPSURE NOVUS 5076-52CM (Lead) ×3 IMPLANT
LEAD CAPSURE NOVUS 5076-58CM (Lead) ×3 IMPLANT
MARKER SKIN DUAL TIP RULER LAB (MISCELLANEOUS) ×3 IMPLANT
PACE AZURE XT DR MRI W1DR01 (Pacemaker) ×3 IMPLANT
PACK PACE INSERTION (MISCELLANEOUS) ×3 IMPLANT
PAD ONESTEP ZOLL R SERIES ADT (MISCELLANEOUS) ×3 IMPLANT
SPONGE LAP 18X18 5 PK (GAUZE/BANDAGES/DRESSINGS) ×3 IMPLANT
SUT SILK 0 SH 30 (SUTURE) ×9 IMPLANT

## 2016-10-06 NOTE — Progress Notes (Signed)
Pt complaints of SOB and HR is in 38-40's. Dr. Anne HahnWillis notified. Received order for chest x-ray.v/s stable. No complaints of chest pain  Or dizzy. Will continue to observe closely.

## 2016-10-06 NOTE — Progress Notes (Signed)
Pt to specials for pacemaker insertion.

## 2016-10-06 NOTE — Progress Notes (Signed)
Pt returned from specials.  

## 2016-10-06 NOTE — Anesthesia Preprocedure Evaluation (Signed)
Anesthesia Evaluation  Patient identified by MRN, date of birth, ID band Patient awake    Reviewed: Allergy & Precautions, H&P , NPO status , Patient's Chart, lab work & pertinent test results, reviewed documented beta blocker date and time   Airway Mallampati: IV  TM Distance: >3 FB Neck ROM: full    Dental no notable dental hx. (+) Teeth Intact, Poor Dentition   Pulmonary neg pulmonary ROS, former smoker,    Pulmonary exam normal breath sounds clear to auscultation       Cardiovascular Exercise Tolerance: Poor hypertension, +CHF  + dysrhythmias  Rhythm:regular Rate:Normal     Neuro/Psych negative neurological ROS  negative psych ROS   GI/Hepatic negative GI ROS, Neg liver ROS,   Endo/Other  negative endocrine ROSdiabetesMorbid obesity  Renal/GU      Musculoskeletal   Abdominal   Peds  Hematology negative hematology ROS (+)   Anesthesia Other Findings Heart block AV noted  Reproductive/Obstetrics negative OB ROS                             Anesthesia Physical Anesthesia Plan  ASA: IV  Anesthesia Plan: MAC   Post-op Pain Management:    Induction:   Airway Management Planned:   Additional Equipment:   Intra-op Plan:   Post-operative Plan:   Informed Consent: I have reviewed the patients History and Physical, chart, labs and discussed the procedure including the risks, benefits and alternatives for the proposed anesthesia with the patient or authorized representative who has indicated his/her understanding and acceptance.     Plan Discussed with: CRNA  Anesthesia Plan Comments:         Anesthesia Quick Evaluation

## 2016-10-06 NOTE — Anesthesia Post-op Follow-up Note (Cosign Needed)
Anesthesia QCDR form completed.        

## 2016-10-06 NOTE — Op Note (Signed)
[facility]   10/05/2016 - 10/06/2016                     2:07 PM  PATIENT:  Edward HatchEdward Keith Waggoner    PRE-OPERATIVE DIAGNOSIS:  chb  POST-OPERATIVE DIAGNOSIS:  Same  PROCEDURE:  INSERTION PACEMAKER  SURGEON:  Marcina MillardAlexander Keison Glendinning, MD    ANESTHESIA:     PREOPERATIVE INDICATIONS:  Edward Hatchdward Keith Goodin is a  61 y.o. male with a diagnosis of chb who failed conservative measures and elected for surgical management.    The risks benefits and alternatives were discussed with the patient preoperatively including but not limited to the risks of infection, bleeding, cardiopulmonary complications, the need for revision surgery, among others, and the patient was willing to proceed.   OPERATIVE PROCEDURE: The patient was brought to the operating room the fasting state. The left pectoral region was prepped and draped in the usual sterile manner. A 6 cm incision was performed over the left pectoral region. The pacemaker pocket was generated by electrocautery and blunt dissection. Access was obtained to left subclavian vein by fine needle aspiration. MRI compatible leads were positioned into the right ventricular apical septum and right atrial appendage under fluoroscopic guidance. After proper thresholds were obtained the leads were sutured in place. The leads were connected to a MRI compatible dual-chamber rate responsive pacemaker generator ( Medtronic Azure XT DR MRI [id number]). The pacemaker generator was positioned into the pocket and the pocket was closed with 2-0 and 4-0 Vicryl, respectively. Steri-Strips and a pressure dressing were applied.

## 2016-10-06 NOTE — Consult Note (Signed)
Patient has complete heart block, agrees to proceed with dual chamber pacemaker.

## 2016-10-06 NOTE — Anesthesia Procedure Notes (Signed)
Date/Time: 10/06/2016 12:25 PM Performed by: Junious SilkNOLES, Norval Slaven Pre-anesthesia Checklist: Patient identified, Emergency Drugs available, Suction available, Patient being monitored and Timeout performed Oxygen Delivery Method: Simple face mask Comments: 100% O2 at 14LPM from circuit + O2 mask with 10 LPM for Fi of 85-88% O2

## 2016-10-06 NOTE — Progress Notes (Signed)
[employer] - [place] at [facility]   PATIENT NAME: Edward Myers    MR#:  [id number]  DATE OF BIRTH:  04-09-1956  SUBJECTIVE:  CHIEF COMPLAINT:   Chief Complaint  Patient presents with  . Shortness of Breath   The patient is a status postop pacemaker placement today. He has no complaints except weakness. REVIEW OF SYSTEMS:  Review of Systems  Constitutional: Positive for malaise/fatigue. Negative for chills and fever.  HENT: Negative for congestion.   Eyes: Negative for blurred vision and double vision.  Respiratory: Negative for cough, shortness of breath, wheezing and stridor.   Cardiovascular: Negative for chest pain, palpitations and leg swelling.  Gastrointestinal: Negative for abdominal pain, blood in stool, diarrhea, melena, nausea and vomiting.  Genitourinary: Negative for dysuria and hematuria.  Musculoskeletal: Negative for back pain.  Skin: Negative for itching and rash.  Neurological: Positive for weakness. Negative for dizziness, focal weakness and loss of consciousness.  Psychiatric/Behavioral: Negative for depression. The patient is not nervous/anxious.     DRUG ALLERGIES:  No Known Allergies VITALS:  Blood pressure 129/85, pulse 87, temperature 98 F (36.7 C), temperature source Oral, resp. rate 16, height 6' (1.829 m), weight (!) 322 lb 12.1 oz (146.4 kg), SpO2 95 %. PHYSICAL EXAMINATION:  Physical Exam  Constitutional: He is oriented to person, place, and time.  HENT:  Head: Normocephalic.  Mouth/Throat: Oropharynx is clear and moist.  Eyes: Conjunctivae and EOM are normal. No scleral icterus.  Neck: Normal range of motion. Neck supple. No JVD present. No tracheal deviation present.  Cardiovascular: Normal rate, regular rhythm and normal heart sounds.  Exam reveals no gallop.   No murmur heard. Pacemaker in dressing on the left side of the chest  Pulmonary/Chest: Effort normal and breath sounds normal. No respiratory distress. He has  no wheezes. He has no rales.  Abdominal: Soft. Bowel sounds are normal. He exhibits no distension. There is no tenderness.  Musculoskeletal: Normal range of motion. He exhibits no edema or tenderness.  Neurological: He is alert and oriented to person, place, and time. No cranial nerve deficit.  Skin: No rash noted. No erythema.  Psychiatric: Affect normal.   LABORATORY PANEL:  Male CBC  Recent Labs Lab 10/06/16 0414  WBC 9.9  HGB 12.3*  HCT 36.8*  PLT 166   ------------------------------------------------------------------------------------------------------------------ Chemistries   Recent Labs Lab 10/05/16 0543  10/06/16 0414  NA 139  --  139  K 3.7  --  3.1*  CL 101  --  101  CO2 31  --  29  GLUCOSE 158*  --  142*  BUN 24*  --  24*  CREATININE 1.11  < > 0.85  CALCIUM 9.0  --  8.7*  MG 1.8  --   --   AST 39  --   --   ALT 47  --   --   ALKPHOS 89  --   --   BILITOT 1.3*  --   --   < > = values in this interval not displayed. RADIOLOGY:  Dg Chest Port 1 View  Result Date: 10/06/2016 CLINICAL DATA:  Status post pacemaker insertion EXAM: PORTABLE CHEST 1 VIEW COMPARISON:  10/06/2016 FINDINGS: Mild bilateral lower lobe scarring/ atelectasis. No focal consolidation. No pleural effusion or pneumothorax. Cardiomegaly.  Left subclavian dual lead pacemaker. IMPRESSION: Left subclavian dual lead pacemaker. No pneumothorax. Electronically Signed   By: Charline Bills M.D.   On: 10/06/2016 15:00   Dg Chest [facility]  Result Date: 10/06/2016 CLINICAL DATA:  Acute onset of worsening cough.  Initial encounter. EXAM: PORTABLE CHEST 1 VIEW COMPARISON:  Chest radiograph performed 10/05/2016 FINDINGS: The lungs are well-aerated. Vascular congestion is noted. Mild bibasilar opacities raise concern for mild interstitial edema. There is no evidence of pleural effusion or pneumothorax. The cardiomediastinal silhouette is enlarged. No acute osseous abnormalities are seen. IMPRESSION:  Vascular congestion and cardiomegaly. Mild bibasilar airspace opacities raise concern for mild interstitial edema. Electronically Signed   By: Roanna RaiderJeffery  Chang M.D.   On: 10/06/2016 01:40   Dg C-arm 1-60 Min-no Report  Result Date: 10/06/2016 Fluoroscopy was utilized by the requesting physician.  No radiographic interpretation.   ASSESSMENT AND PLAN:   61 year old male with a history of OSA, chronic systolic heart failure ejection fraction 30-35% and diabetes who presents with shortness of breath and found to have complete heart block.  1. Complete heart block, possibly etiology shortness of breath: Stopped digitoxin and Coreg for 48 hours.  Status post pacemaker placement today. Echocardiograph done yesterday showed ejection fraction 30-35%.  2. Chronic systolic heart failure ejection fraction 30-35%: Chest x-ray not consistent with CHF Echocardiograph done yesterday showed ejection fraction 30-35%. Continue oral Lasix Holding beta blocker due to problem #1 as well as hypotension and holding ACE inhibitor due to hypotension.  3. History of hypertension with hypotension possibly from bradycardia: Hold hypertensive medications. Given Normal saline iv.  4. OSA: Order CPAP at night  CPAP  5. Morbid obesity: Patient is encouraged to lose weight  6. Diabetes: Sliding scale insulin with ADA diet Continue 70/30 Hold metformin 7. BPH: Continue Flomax  8. Hyperlipidemia: Continue Lipitor  Hypokalemia. Given potassium in the follow-up level.   All the records are reviewed and case discussed with Care Management/Social Worker. Management plans discussed with the patient, wife and daughter and they are in agreement.  CODE STATUS: Full Code  TOTAL TIME TAKING CARE OF THIS PATIENT: 36 minutes.   More than 50% of the time was spent in counseling/coordination of care: YES  POSSIBLE D/C IN 1-2 DAYS, DEPENDING ON CLINICAL CONDITION.   Shaune Pollackhen, Derius Ghosh M.D on 10/06/2016 at 5:58  PM  Between 7am to 6pm - Pager - [phone]  After 6pm go to [email or web address] - Social research officer, governmentpassword EPAS [email or web address]  [facility] [place] [facility]  Office  [phone]  CC: Primary care physician; Lynnea FerrierBERT J KLEIN III, MD  Note: This dictation was prepared with Dragon dictation along with smaller phrase technology. Any transcriptional errors that result from this process are unintentional.

## 2016-10-06 NOTE — Transfer of Care (Signed)
Immediate Anesthesia Transfer of Care Note  Patient: Edward Myers  Procedure(s) Performed: Procedure(s): INSERTION PACEMAKER (Left)  Patient Location: PACU  Anesthesia Type:MAC  Level of Consciousness: awake and sedated  Airway & Oxygen Therapy: Patient Spontanous Breathing and Patient connected to face mask oxygen  Post-op Assessment: Report given to RN and Post -op Vital signs reviewed and stable  Post vital signs: Reviewed and stable  Last Vitals:  Vitals:   10/06/16 1000 10/06/16 1100  BP:  117/72  Pulse: (!) 40 (!) 42  Resp: (!) 26 18  Temp: 36.8 C     Last Pain:  Vitals:   10/06/16 1044  TempSrc:   PainSc: 0-No pain         Complications: No apparent anesthesia complications

## 2016-10-06 NOTE — Progress Notes (Signed)
[facility]  SUBJECTIVE: sob   Vitals:   10/06/16 0300 10/06/16 0400 10/06/16 0500 10/06/16 0600  BP: (!) 100/49 (!) 118/103 114/63 117/69  Pulse: (!) 38 (!) 40 (!) 35 (!) 39  Resp: 10 12 (!) 21 (!) 22  Temp:   98.2 F (36.8 C)   TempSrc:   Oral   SpO2: 99% 96% 96% 95%  Weight:      Height:        Intake/Output Summary (Last 24 hours) at 10/06/16 0659 Last data filed at 10/06/16 0500  Gross per 24 hour  Intake              200 ml  Output             2470 ml  Net            -2270 ml    LABS: Basic Metabolic Panel:  Recent Labs  40/98/1103/25/18 0543 10/05/16 0902 10/06/16 0414  NA 139  --  139  K 3.7  --  3.1*  CL 101  --  101  CO2 31  --  29  GLUCOSE 158*  --  142*  BUN 24*  --  24*  CREATININE 1.11 1.01 0.85  CALCIUM 9.0  --  8.7*  MG 1.8  --   --   PHOS 3.6  --   --    Liver Function Tests:  Recent Labs  10/05/16 0543  AST 39  ALT 47  ALKPHOS 89  BILITOT 1.3*  PROT 7.3  ALBUMIN 4.1   No results for input(s): LIPASE, AMYLASE in the last 72 hours. CBC:  Recent Labs  10/05/16 0902 10/06/16 0414  WBC 10.3 9.9  HGB 11.8* 12.3*  HCT 35.8* 36.8*  MCV 84.7 83.6  PLT 164 166   Cardiac Enzymes:  Recent Labs  10/05/16 1508 10/05/16 2054 10/06/16 0414  TROPONINI <0.03 0.04* 0.04*   BNP: Invalid input(s): POCBNP D-Dimer: No results for input(s): DDIMER in the last 72 hours. Hemoglobin A1C: No results for input(s): HGBA1C in the last 72 hours. Fasting Lipid Panel: No results for input(s): CHOL, HDL, LDLCALC, TRIG, CHOLHDL, LDLDIRECT in the last 72 hours. Thyroid Function Tests:  Recent Labs  10/05/16 0902  TSH 3.445   Anemia Panel: No results for input(s): VITAMINB12, FOLATE, FERRITIN, TIBC, IRON, RETICCTPCT in the last 72 hours.   Physical Exam: Blood pressure 117/69, pulse (!) 39, temperature 98.2 F (36.8 C), temperature source Oral, resp. rate (!) 22, height 6' (1.829 m), weight (!) 146.4 kg (322  lb 12.1 oz), SpO2 95 %.   Wt Readings from Last 1 Encounters:  10/05/16 (!) 146.4 kg (322 lb 12.1 oz)     General appearance: alert and cooperative Resp: clear to auscultation bilaterally Cardio: regular rate and rhythm GI: soft, non-tender; bowel sounds normal; no masses,  no organomegaly Neurologic: Grossly normal  TELEMETRY: Reviewed telemetry pt in chb with intermitent conductio:  ASSESSMENT AND PLAN:  Active Problems:   Heart block  Persistent CHB. Has been off of carvedilol and digoxin for 48 hours. Will need PPM will discuss with Dr. Darrold JunkerParaschos for insertion today. Replete Stann MainlandK.   Kenneth A Fath, MD, [facility] RegionalFACC 10/06/2016 6:59 AM

## 2016-10-07 ENCOUNTER — Encounter: Payer: Self-pay | Admitting: Cardiology

## 2016-10-07 ENCOUNTER — Inpatient Hospital Stay: Payer: BLUE CROSS/BLUE SHIELD

## 2016-10-07 LAB — POTASSIUM: POTASSIUM: 3.8 mmol/L (ref 3.5–5.1)

## 2016-10-07 LAB — BASIC METABOLIC PANEL
Anion gap: 8 (ref 5–15)
BUN: 18 mg/dL (ref 6–20)
CALCIUM: 8.5 mg/dL — AB (ref 8.9–10.3)
CO2: 30 mmol/L (ref 22–32)
CREATININE: 0.7 mg/dL (ref 0.61–1.24)
Chloride: 103 mmol/L (ref 101–111)
GFR calc Af Amer: >60 mL/min (ref >60)
GLUCOSE: 147 mg/dL — AB (ref 65–99)
Potassium: 3.1 mmol/L — ABNORMAL LOW (ref 3.5–5.1)
Sodium: 141 mmol/L (ref 135–145)

## 2016-10-07 LAB — GLUCOSE, CAPILLARY
GLUCOSE-CAPILLARY: 93 mg/dL (ref 65–99)
Glucose-Capillary: 136 mg/dL — ABNORMAL HIGH (ref 65–99)
Glucose-Capillary: 169 mg/dL — ABNORMAL HIGH (ref 65–99)
Glucose-Capillary: 123 mg/dL — ABNORMAL HIGH (ref 65–99)

## 2016-10-07 LAB — MAGNESIUM: MAGNESIUM: 2.2 mg/dL (ref 1.7–2.4)

## 2016-10-07 MED ORDER — POTASSIUM CHLORIDE CRYS ER 20 MEQ PO TBCR
40.0000 meq | EXTENDED_RELEASE_TABLET | Freq: Once | ORAL | Status: AC
  Administered 2016-10-07: 40 meq via ORAL
  Filled 2016-10-07: qty 2

## 2016-10-07 MED ORDER — CEFTRIAXONE SODIUM 1 G IJ SOLR: 1.0000 g | INTRAMUSCULAR | Status: DC

## 2016-10-07 MED ORDER — DIGOXIN 0.25 MG/ML IJ SOLN
0.2500 mg | Freq: Once | INTRAMUSCULAR | Status: AC
  Administered 2016-10-07: 0.25 mg via INTRAVENOUS
  Filled 2016-10-07: qty 2

## 2016-10-07 MED ORDER — FUROSEMIDE 10 MG/ML IJ SOLN
40.0000 mg | Freq: Three times a day (TID) | INTRAMUSCULAR | Status: DC
  Administered 2016-10-07 – 2016-10-08 (×3): 40 mg via INTRAVENOUS
  Filled 2016-10-07 (×3): qty 4

## 2016-10-07 MED ORDER — IPRATROPIUM-ALBUTEROL 0.5-2.5 (3) MG/3ML IN SOLN
3.0000 mL | Freq: Four times a day (QID) | RESPIRATORY_TRACT | Status: DC
  Administered 2016-10-07 – 2016-10-08 (×4): 3 mL via RESPIRATORY_TRACT
  Filled 2016-10-07 (×4): qty 3

## 2016-10-07 MED ORDER — DEXTROSE 5 % IV SOLN
250.0000 mg | INTRAVENOUS | Status: DC
  Administered 2016-10-07: 250 mg via INTRAVENOUS
  Filled 2016-10-07 (×2): qty 250

## 2016-10-07 MED ORDER — DIGOXIN 250 MCG PO TABS
0.2500 mg | ORAL_TABLET | Freq: Every day | ORAL | Status: DC
  Administered 2016-10-08: 0.25 mg via ORAL
  Filled 2016-10-07: qty 1

## 2016-10-07 MED ORDER — CARVEDILOL 6.25 MG PO TABS
6.2500 mg | ORAL_TABLET | Freq: Two times a day (BID) | ORAL | Status: DC
  Administered 2016-10-07 – 2016-10-08 (×3): 6.25 mg via ORAL
  Filled 2016-10-07 (×3): qty 1

## 2016-10-07 MED ORDER — DIGOXIN 250 MCG PO TABS: 0.2500 mg | ORAL_TABLET | Freq: Every day | ORAL | Status: DC

## 2016-10-07 MED ORDER — ENOXAPARIN SODIUM 40 MG/0.4ML ~~LOC~~ SOLN
40.0000 mg | Freq: Two times a day (BID) | SUBCUTANEOUS | Status: DC
  Administered 2016-10-07 – 2016-10-08 (×3): 40 mg via SUBCUTANEOUS
  Filled 2016-10-07 (×3): qty 0.4

## 2016-10-07 MED ORDER — CEPHALEXIN 500 MG PO CAPS
500.0000 mg | ORAL_CAPSULE | Freq: Two times a day (BID) | ORAL | Status: DC
  Administered 2016-10-07 – 2016-10-08 (×3): 500 mg via ORAL
  Filled 2016-10-07: qty 2
  Filled 2016-10-07: qty 1
  Filled 2016-10-07: qty 2

## 2016-10-07 MED ORDER — MAGNESIUM SULFATE 2 GM/50ML IV SOLN
2.0000 g | Freq: Once | INTRAVENOUS | Status: AC
  Administered 2016-10-07: 2 g via INTRAVENOUS
  Filled 2016-10-07: qty 50

## 2016-10-07 NOTE — Anesthesia Postprocedure Evaluation (Signed)
Anesthesia Post Note  Patient: Edward Myers  Procedure(s) Performed: Procedure(s) (LRB): INSERTION PACEMAKER (Left)  Patient location during evaluation: Nursing Unit Anesthesia Type: MAC Level of consciousness: awake, awake and alert and oriented Pain management: pain level controlled Respiratory status: spontaneous breathing and nonlabored ventilation Cardiovascular status: stable Postop Assessment: adequate PO intake and no signs of nausea or vomiting Anesthetic complications: no     Last Vitals:  Vitals:   10/07/16 0500 10/07/16 0600  BP: (!) 149/95 137/90  Pulse: 91 80  Resp: 20 17  Temp: 37.1 C     Last Pain:  Vitals:   10/07/16 0500  TempSrc: Oral  PainSc:                  Ricki Miller

## 2016-10-07 NOTE — Progress Notes (Addendum)
Patient HR increasing to 140's, 150's nonsustatined- Patient in no acute distress, no resp distress and denying any pain at this time. Primary MD- Elisabeth PigeonVachhani made aware- Cardiologist paged as well.   1410- Dr. Lady GaryFath aware of patient status. States to restart Digoxin. Give IV dose today of 0.25mg . Patient to be re-started on PO digoxin tomorrow if still in hospital

## 2016-10-07 NOTE — Progress Notes (Signed)
PHARMACY - CRITICAL CARE PROGRESS NOTE  Pharmacy Consult for Electrolyte Monitoring   No Known Allergies  Patient Measurements: Height: 6' (182.9 cm) Weight: (!) 322 lb 12.1 oz (146.4 kg) IBW/kg (Calculated) : 77.6  Vital Signs: Temp: 98.4 F (36.9 C) (03/27 1434) Temp Source: Oral (03/27 1434) BP: 111/86 (03/27 1434) Pulse Rate: 78 (03/27 1200) Intake/Output from previous day: 03/26 0701 - 03/27 0700 In: 690 [P.O.:340; I.V.:300; IV Piggyback:50] Out: 2100 [Urine:2090; Blood:10] Intake/Output from this shift: Total I/O In: 480 [P.O.:480] Out: -  Vent settings for last 24 hours: FiO2 (%):  [36 %] 36 %  Labs:  Recent Labs  10/05/16 0543 10/05/16 0902 10/06/16 0414 10/07/16 0442  WBC 11.3* 10.3 9.9  --   HGB 13.0 11.8* 12.3*  --   HCT 39.3* 35.8* 36.8*  --   PLT 164 164 166  --   CREATININE 1.11 1.01 0.85 0.70  MG 1.8  --  1.7  --   PHOS 3.6  --   --   --   ALBUMIN 4.1  --   --   --   PROT 7.3  --   --   --   AST 39  --   --   --   ALT 47  --   --   --   ALKPHOS 89  --   --   --   BILITOT 1.3*  --   --   --    Estimated Creatinine Clearance: 147.8 mL/min (by C-G formula based on SCr of 0.7 mg/dL).   Recent Labs  10/06/16 2232 10/07/16 0709 10/07/16 1123  GLUCAP 113* 136* 169*    Microbiology: Recent Results (from the past 720 hour(s))  MRSA PCR Screening     Status: None   Collection Time: 10/05/16  8:59 AM  Result Value Ref Range Status   MRSA by PCR NEGATIVE NEGATIVE Final    Comment:        The GeneXpert MRSA Assay (FDA approved for NASAL specimens only), is one component of a comprehensive MRSA colonization surveillance program. It is not intended to diagnose MRSA infection nor to guide or monitor treatment for MRSA infections.     Assessment: 61 y/o M with a h/o CHF and OSA admitted with SOB and found to be in heart block. Patient is s/p PPM. Patient is on furosemide 80 mg bid and KCl 20 meq tid.   Plan:  Will give an additional  KCl 40 meq once and f/u K and Mg at 1800.   Edward Myers, Edward Myers 10/07/2016,4:06 PM

## 2016-10-07 NOTE — Progress Notes (Signed)
[employer] - [place] at [facility]   PATIENT NAME: Edward Myers    MR#:  [id number]  DATE OF BIRTH:  1956/06/08  SUBJECTIVE:  CHIEF COMPLAINT:   Chief Complaint  Patient presents with  . Shortness of Breath   The patient is a status postop pacemaker placement 10/06/16. He has no complaints except weakness. He is still requiring 4 ltr oxygen, but said his leg edema is better.  REVIEW OF SYSTEMS:  Review of Systems  Constitutional: Positive for malaise/fatigue. Negative for chills and fever.  HENT: Negative for congestion.   Eyes: Negative for blurred vision and double vision.  Respiratory: Negative for cough, shortness of breath, wheezing and stridor.   Cardiovascular: Negative for chest pain, palpitations and leg swelling.  Gastrointestinal: Negative for abdominal pain, blood in stool, diarrhea, melena, nausea and vomiting.  Genitourinary: Negative for dysuria and hematuria.  Musculoskeletal: Negative for back pain.  Skin: Negative for itching and rash.  Neurological: Positive for weakness. Negative for dizziness, focal weakness and loss of consciousness.  Psychiatric/Behavioral: Negative for depression. The patient is not nervous/anxious.     DRUG ALLERGIES:  No Known Allergies VITALS:  Blood pressure 120/69, pulse 94, temperature 98.4 F (36.9 C), temperature source Oral, resp. rate 19, height 6' (1.829 m), weight (!) 146.4 kg (322 lb 12.1 oz), SpO2 95 %. PHYSICAL EXAMINATION:  Physical Exam  Constitutional: He is oriented to person, place, and time.  HENT:  Head: Normocephalic.  Mouth/Throat: Oropharynx is clear and moist.  Eyes: Conjunctivae and EOM are normal. No scleral icterus.  Neck: Normal range of motion. Neck supple. No JVD present. No tracheal deviation present.  Cardiovascular: Normal rate, regular rhythm and normal heart sounds.  Exam reveals no gallop.   No murmur heard. Pacemaker in dressing on the left side of the chest    Pulmonary/Chest: Effort normal and breath sounds normal. No respiratory distress. He has no wheezes. He has no rales.  Abdominal: Soft. Bowel sounds are normal. He exhibits no distension. There is no tenderness.  Musculoskeletal: Normal range of motion. He exhibits no edema or tenderness.  Neurological: He is alert and oriented to person, place, and time. No cranial nerve deficit.  Skin: No rash noted. No erythema.  Psychiatric: Affect normal.   LABORATORY PANEL:  Male CBC  Recent Labs Lab 10/06/16 0414  WBC 9.9  HGB 12.3*  HCT 36.8*  PLT 166   ------------------------------------------------------------------------------------------------------------------ Chemistries   Recent Labs Lab 10/05/16 0543  10/07/16 0442 10/07/16 1748  NA 139  < > 141  --   K 3.7  < > 3.1* 3.8  CL 101  < > 103  --   CO2 31  < > 30  --   GLUCOSE 158*  < > 147*  --   BUN 24*  < > 18  --   CREATININE 1.11  < > 0.70  --   CALCIUM 9.0  < > 8.5*  --   MG 1.8  < >  --  2.2  AST 39  --   --   --   ALT 47  --   --   --   ALKPHOS 89  --   --   --   BILITOT 1.3*  --   --   --   < > = values in this interval not displayed. RADIOLOGY:  Dg Chest 2 View  Result Date: 10/07/2016 CLINICAL DATA:  Hypoxia recent placement of cardiac pacemaker EXAM: CHEST  2 VIEW  COMPARISON:  Chest x-ray of 10/06/2016 FINDINGS: Moderate cardiomegaly is stable. However there are more prominent interstitial markings with tiny effusions suggestive of interstitial edema of congestive heart failure. Pacemaker wires remain. No bony abnormality is seen. IMPRESSION: Suspect mild interstitial edema with tiny effusions.  Cardiomegaly Electronically Signed   By: Dwyane Dee M.D.   On: 10/07/2016 13:41   ASSESSMENT AND PLAN:   61 year old male with a history of OSA, chronic systolic heart failure ejection fraction 30-35% and diabetes who presents with shortness of breath and found to have complete heart block.  1. Complete heart  block, possibly etiology shortness of breath: Stopped digitoxin and Coreg for 48 hours.  Status post pacemaker placement 10/06/16. Echocardiograph done ,showed ejection fraction 30-35%.  2. Ac on Chronic systolic heart failure ejection fraction 30-35%: Chest x-ray consistent with CHF Was on high dose oral Lasix Switch to IV lasix. On coreg.  3. History of hypertension with hypotension possibly from bradycardia: Now BP stable, he is actually started having some tachycardia Restarted coreg and digoxine.  4. OSA: Order CPAP at night  CPAP  5. Morbid obesity: Patient is encouraged to lose weight  6. Diabetes: Sliding scale insulin with ADA diet Continue 70/30 Hold metformin 7. BPH: Continue Flomax  8. Hyperlipidemia: Continue Lipitor  Hypokalemia. Given potassium in the follow-up level.   All the records are reviewed and case discussed with Care Management/Social Worker. Management plans discussed with the patient, wife and daughter and they are in agreement.  CODE STATUS: Full Code  TOTAL TIME TAKING CARE OF THIS PATIENT: 36 minutes.   More than 50% of the time was spent in counseling/coordination of care: YES  Spoke to his son in room and later his sister on phone.  POSSIBLE D/C IN 1-2 DAYS, DEPENDING ON CLINICAL CONDITION.   Altamese Dilling M.D on 10/07/2016 at 10:09 PM  Between 7am to 6pm - Pager - [phone]  After 6pm go to [email or web address] - Social research officer, government  [facility] [place] [facility]  Office  [phone]  CC: Primary care physician; Lynnea Ferrier, MD  Note: This dictation was prepared with Dragon dictation along with smaller phrase technology. Any transcriptional errors that result from this process are unintentional.

## 2016-10-07 NOTE — Progress Notes (Signed)
61 y/o M ordered Lovenox 40 mg daily for DVT prophylaxis. Due to BMI > 40, will increase Lovenox to 40 mg bid.   Luisa HartScott Yareli Carthen, PharmD Clinical Pharmacist

## 2016-10-07 NOTE — Progress Notes (Signed)
Pt placed on [facility] CPAP C-3. CPAP plugged into red outlet. 4L O2 inline. Pt tolerating well.  

## 2016-10-07 NOTE — Progress Notes (Signed)
MD made aware of Patients O2 sats hovering around 90% on 4 Liters. Patient denies any chronic O2 usage but does state that she is in the process of obtaining a C-pap machine. Patient in no acute or resp distress, denies any pain at this time. Dr. Elisabeth PigeonVachhani aware and orders to follow.

## 2016-10-07 NOTE — Progress Notes (Signed)
Patient's wife arrived and expressed concern that patient had not been bathed today. Patient voiced no complaints at this time. Patient asked if he would like to bathe now, patient stated that he would like to wait until later on this evening and that Wife would most likely assist him in washing up. Bathing items for bath (towels , washcloths, soap, lotion) and oral care provided for patient. Patient currently up sitting in chair, visiting with guests and wife.

## 2016-10-07 NOTE — Progress Notes (Signed)
PHARMACY - CRITICAL CARE PROGRESS NOTE  Pharmacy Consult for Electrolyte Monitoring   No Known Allergies  Patient Measurements: Height: 6' (182.9 cm) Weight: (!) 322 lb 12.1 oz (146.4 kg) IBW/kg (Calculated) : 77.6  Vital Signs: Temp: 98.4 F (36.9 C) (03/27 1434) Temp Source: Oral (03/27 1434) BP: 120/69 (03/27 1808) Pulse Rate: 94 (03/27 1808) Intake/Output from previous day: 03/26 0701 - 03/27 0700 In: 690 [P.O.:340; I.V.:300; IV Piggyback:50] Out: 2100 [Urine:2090; Blood:10] Intake/Output from this shift: No intake/output data recorded. Vent settings for last 24 hours: FiO2 (%):  [36 %] 36 %  Labs:  Recent Labs  10/05/16 0543 10/05/16 0902 10/06/16 0414 10/07/16 0442 10/07/16 1748  WBC 11.3* 10.3 9.9  --   --   HGB 13.0 11.8* 12.3*  --   --   HCT 39.3* 35.8* 36.8*  --   --   PLT 164 164 166  --   --   CREATININE 1.11 1.01 0.85 0.70  --   MG 1.8  --  1.7  --  2.2  PHOS 3.6  --   --   --   --   ALBUMIN 4.1  --   --   --   --   PROT 7.3  --   --   --   --   AST 39  --   --   --   --   ALT 47  --   --   --   --   ALKPHOS 89  --   --   --   --   BILITOT 1.3*  --   --   --   --    Estimated Creatinine Clearance: 147.8 mL/min (by C-G formula based on SCr of 0.7 mg/dL).   Recent Labs  10/07/16 0709 10/07/16 1123 10/07/16 1605  GLUCAP 136* 169* 123*    Microbiology: Recent Results (from the past 720 hour(s))  MRSA PCR Screening     Status: None   Collection Time: 10/05/16  8:59 AM  Result Value Ref Range Status   MRSA by PCR NEGATIVE NEGATIVE Final    Comment:        The GeneXpert MRSA Assay (FDA approved for NASAL specimens only), is one component of a comprehensive MRSA colonization surveillance program. It is not intended to diagnose MRSA infection nor to guide or monitor treatment for MRSA infections.     Assessment: 61 y/o M with a h/o CHF and OSA admitted with SOB and found to be in heart block. Patient is s/p PPM. Patient is on  furosemide 80 mg bid and KCl 20 meq tid.   Plan:  K = 3.8 and Mg = 2.2 this evening after repletion. No additional supplementation necessary at this time - will follow up with AM labs tomorrow.   Cindi CarbonMary M Bertine Schlottman, PharmD Clinical Pharmacist 10/07/2016,7:20 PM

## 2016-10-07 NOTE — Progress Notes (Signed)
[facility]  SUBJECTIVE: s/p ppm. Feels better   Vitals:   10/07/16 0300 10/07/16 0400 10/07/16 0500 10/07/16 0600  BP: (!) 141/78 127/81 (!) 149/95 137/90  Pulse: 88 93 91 80  Resp: (!) 24 (!) 24 20 17   Temp:   98.8 F (37.1 C)   TempSrc:   Oral   SpO2: (!) 89% (!) 84% (!) 86% (!) 87%  Weight:      Height:        Intake/Output Summary (Last 24 hours) at 10/07/16 0731 Last data filed at 10/07/16 0535  Gross per 24 hour  Intake              690 ml  Output             2100 ml  Net            -1410 ml    LABS: Basic Metabolic Panel:  Recent Labs  78/29/5603/25/18 0543  10/06/16 0414 10/07/16 0442  NA 139  --  139 141  K 3.7  --  3.1* 3.1*  CL 101  --  101 103  CO2 31  --  29 30  GLUCOSE 158*  --  142* 147*  BUN 24*  --  24* 18  CREATININE 1.11  < > 0.85 0.70  CALCIUM 9.0  --  8.7* 8.5*  MG 1.8  --  1.7  --   PHOS 3.6  --   --   --   < > = values in this interval not displayed. Liver Function Tests:  Recent Labs  10/05/16 0543  AST 39  ALT 47  ALKPHOS 89  BILITOT 1.3*  PROT 7.3  ALBUMIN 4.1   No results for input(s): LIPASE, AMYLASE in the last 72 hours. CBC:  Recent Labs  10/05/16 0902 10/06/16 0414  WBC 10.3 9.9  HGB 11.8* 12.3*  HCT 35.8* 36.8*  MCV 84.7 83.6  PLT 164 166   Cardiac Enzymes:  Recent Labs  10/05/16 1508 10/05/16 2054 10/06/16 0414  TROPONINI <0.03 0.04* 0.04*   BNP: Invalid input(s): POCBNP D-Dimer: No results for input(s): DDIMER in the last 72 hours. Hemoglobin A1C: No results for input(s): HGBA1C in the last 72 hours. Fasting Lipid Panel: No results for input(s): CHOL, HDL, LDLCALC, TRIG, CHOLHDL, LDLDIRECT in the last 72 hours. Thyroid Function Tests:  Recent Labs  10/05/16 0902  TSH 3.445   Anemia Panel: No results for input(s): VITAMINB12, FOLATE, FERRITIN, TIBC, IRON, RETICCTPCT in the last 72 hours.   Physical Exam: Blood pressure 137/90, pulse 80, temperature 98.8 F  (37.1 C), temperature source Oral, resp. rate 17, height 6' (1.829 m), weight (!) 146.4 kg (322 lb 12.1 oz), SpO2 (!) 87 %.   Wt Readings from Last 1 Encounters:  10/05/16 (!) 146.4 kg (322 lb 12.1 oz)     General appearance: alert and cooperative Resp: clear to auscultation bilaterally Cardio: regular rate and rhythm Extremities: extremities normal, atraumatic, no cyanosis or edema Neurologic: Grossly normal  TELEMETRY: Reviewed telemetry pt in atrial sensed, v paced  ASSESSMENT AND PLAN:  Active Problems:   Heart block-s/p dual chamber pacemaker place. Currently atrial sensed and ventricular paced. Pt states he feels better. Will ambulate today and if stable, consider discharge to home with outpatient follow up with Dr. Juliann Paresallwood. Will need Keflex 500 mg bid for 10 days post discharge. Will resume carvedilol at 6.25 mg bid    Dalia HeadingKenneth A Fath, MD, [facility] 10/07/2016 7:31 AM

## 2016-10-08 LAB — GLUCOSE, CAPILLARY
GLUCOSE-CAPILLARY: 138 mg/dL — AB (ref 65–99)
Glucose-Capillary: 145 mg/dL — ABNORMAL HIGH (ref 65–99)

## 2016-10-08 LAB — MAGNESIUM: Magnesium: 2 mg/dL (ref 1.7–2.4)

## 2016-10-08 LAB — BASIC METABOLIC PANEL
ANION GAP: 7 (ref 5–15)
Anion gap: 7 (ref 5–15)
BUN: 17 mg/dL (ref 6–20)
BUN: 17 mg/dL (ref 6–20)
CALCIUM: 9 mg/dL (ref 8.9–10.3)
CO2: 31 mmol/L (ref 22–32)
CO2: 29 mmol/L (ref 22–32)
CREATININE: 0.77 mg/dL (ref 0.61–1.24)
Calcium: 9.2 mg/dL (ref 8.9–10.3)
Chloride: 103 mmol/L (ref 101–111)
Chloride: 102 mmol/L (ref 101–111)
Creatinine, Ser: 0.76 mg/dL (ref 0.61–1.24)
GFR calc Af Amer: >60 mL/min (ref >60)
GFR calc non Af Amer: >60 mL/min (ref >60)
Glucose, Bld: 116 mg/dL — ABNORMAL HIGH (ref 65–99)
Glucose, Bld: 142 mg/dL — ABNORMAL HIGH (ref 65–99)
POTASSIUM: 3.3 mmol/L — AB (ref 3.5–5.1)
POTASSIUM: 3.5 mmol/L (ref 3.5–5.1)
SODIUM: 138 mmol/L (ref 135–145)
Sodium: 141 mmol/L (ref 135–145)

## 2016-10-08 LAB — PHOSPHORUS: PHOSPHORUS: 3.6 mg/dL (ref 2.5–4.6)

## 2016-10-08 MED ORDER — LORATADINE 10 MG PO TABS: 10.0000 mg | ORAL_TABLET | Freq: Every day | ORAL | Status: DC | PRN

## 2016-10-08 MED ORDER — CEPHALEXIN 500 MG PO CAPS: 500.0000 mg | ORAL_CAPSULE | Freq: Two times a day (BID) | ORAL | 0 refills | Status: AC

## 2016-10-08 MED ORDER — POTASSIUM CHLORIDE 2 MEQ/ML IV SOLN
30.0000 meq | Freq: Once | INTRAVENOUS | Status: AC
  Administered 2016-10-08: 30 meq via INTRAVENOUS
  Filled 2016-10-08: qty 15

## 2016-10-08 MED ORDER — AZITHROMYCIN 250 MG PO TABS
250.0000 mg | ORAL_TABLET | Freq: Every day | ORAL | Status: DC
  Administered 2016-10-08: 250 mg via ORAL
  Filled 2016-10-08: qty 1

## 2016-10-08 MED ORDER — DIGOXIN 250 MCG PO TABS: 0.2500 mg | ORAL_TABLET | Freq: Every day | ORAL | 0 refills | Status: AC

## 2016-10-08 MED ORDER — HYDROCODONE-ACETAMINOPHEN 5-325 MG PO TABS: 1.0000 | ORAL_TABLET | Freq: Four times a day (QID) | ORAL | 0 refills | Status: AC | PRN

## 2016-10-08 MED ORDER — SALINE SPRAY 0.65 % NA SOLN
1.0000 | NASAL | Status: DC | PRN
  Filled 2016-10-08: qty 44

## 2016-10-08 NOTE — Discharge Summary (Signed)
[facility] [place] [facility]   PATIENT NAME: Edward Myers    MR#:  [id number]  DATE OF BIRTH:  14-Jan-1956  DATE OF ADMISSION:  10/05/2016 ADMITTING PHYSICIAN: Adrian SaranSital Mody, MD  DATE OF DISCHARGE: 10/08/2016  PRIMARY CARE PHYSICIAN: Curtis SitesBERT J KLEIN III, MD    ADMISSION DIAGNOSIS:  Complete heart block (HCC) [I44.2] Dyspnea, unspecified type [R06.00]  DISCHARGE DIAGNOSIS:  Active Problems:   Heart block   s/p pace maker   Ac on ch systolic CHF  SECONDARY DIAGNOSIS:   Past Medical History:  Diagnosis Date  . Chronic gouty arthropathy   . Diabetes mellitus without complication (HCC)   . Glaucoma   . Hyperlipidemia   . Hypertension   . Morbid obesity (HCC)   . Rhinitis medicamentosa   . Urethral stricture    scheduled for repair May 2017  . UTI (urinary tract infection)     HOSPITAL COURSE:   61 year old male with a history of OSA,chronic systolic heart failure ejection fraction 30-35% and diabetes who presents with shortness of breath and found to have complete heart block.  1. Complete heart block, possibly etiology shortness of breath: Stopped digitoxin and Coreg for 48 hours.  Status post pacemaker placement 10/06/16. Echocardiograph done ,showed ejection fraction 30-35%.   Instruction of post op care given to pt by Dr. Juliann Paresallwood regarding left arm use, weight bearing and showers.  2. Ac on Chronic systolic heart failure ejection fraction 30-35%: Chest x-ray consistent with CHF Was on high dose oral Lasix Switch to IV lasix. On coreg.  responded nicely, now walked comfortablly on room air.  3. History of hypertension with hypotension possibly from bradycardia: Now BP stable, he is actually started having some tachycardia Restarted coreg and digoxine.  4. OSA: Order CPAP at night  CPAP  5. Morbid obesity: Patient is encouraged to lose weight  6. Diabetes: Sliding scale insulin with ADA diet Continue 70/30 Hold  metformin  7. BPH: Continue Flomax  8. Hyperlipidemia: Continue Lipitor   DISCHARGE CONDITIONS:   Stable.  CONSULTS OBTAINED:  Treatment Team:  Dalia HeadingKenneth A Fath, MD Altamese DillingVaibhavkumar Abner Ardis, MD  DRUG ALLERGIES:  No Known Allergies  DISCHARGE MEDICATIONS:   Current Discharge Medication List    START taking these medications   Details  cephALEXin (KEFLEX) 500 MG capsule Take 1 capsule (500 mg total) by mouth every 12 (twelve) hours. Qty: 18 capsule, Refills: 0    HYDROcodone-acetaminophen (NORCO/VICODIN) 5-325 MG tablet Take 1 tablet by mouth every 6 (six) hours as needed for moderate pain or severe pain. Qty: 15 tablet, Refills: 0      CONTINUE these medications which have CHANGED   Details  digoxin (LANOXIN) 0.25 MG tablet Take 1 tablet (0.25 mg total) by mouth daily. Qty: 30 tablet, Refills: 0      CONTINUE these medications which have NOT CHANGED   Details  allopurinol (ZYLOPRIM) 300 MG tablet Take 300 mg by mouth 2 (two) times daily.    aspirin EC 81 MG tablet Take 81 mg by mouth daily.    atorvastatin (LIPITOR) 80 MG tablet Take 80 mg by mouth daily.    carvedilol (COREG) 6.25 MG tablet Take 6.25 mg by mouth 2 (two) times daily with a meal.    furosemide (LASIX) 80 MG tablet Take 80 mg by mouth 2 (two) times daily.    insulin aspart protamine - aspart (NOVOLOG MIX 70/30 FLEXPEN) (70-30) 100 UNIT/ML FlexPen Inject 25 Units into the skin daily with breakfast.  lisinopril (PRINIVIL,ZESTRIL) 10 MG tablet Take 10 mg by mouth 2 (two) times daily.    metFORMIN (GLUCOPHAGE) 1000 MG tablet Take 1,000 mg by mouth 2 (two) times daily with a meal.    tamsulosin (FLOMAX) 0.4 MG CAPS capsule Take 0.4 mg by mouth.         DISCHARGE INSTRUCTIONS:    Follow with cardiology clinic in 1 week.  If you experience worsening of your admission symptoms, develop shortness of breath, life threatening emergency, suicidal or homicidal thoughts you must seek medical attention  immediately by calling 911 or calling your MD immediately  if symptoms less severe.  You Must read complete instructions/literature along with all the possible adverse reactions/side effects for all the Medicines you take and that have been prescribed to you. Take any new Medicines after you have completely understood and accept all the possible adverse reactions/side effects.   Please note  You were cared for by a hospitalist during your hospital stay. If you have any questions about your discharge medications or the care you received while you were in the hospital after you are discharged, you can call the unit and asked to speak with the hospitalist on call if the hospitalist that took care of you is not available. Once you are discharged, your primary care physician will handle any further medical issues. Please note that NO REFILLS for any discharge medications will be authorized once you are discharged, as it is imperative that you return to your primary care physician (or establish a relationship with a primary care physician if you do not have one) for your aftercare needs so that they can reassess your need for medications and monitor your lab values.    Today   CHIEF COMPLAINT:   Chief Complaint  Patient presents with  . Shortness of Breath    HISTORY OF PRESENT ILLNESS:  Edward Myers  is a 61 y.o. male with a known history of Chronic systolic heart failure ejection fraction 30-35%, morbid obesity, diabetes and essential hypertension who presents with above complaint. Patient reports increasing shortness of breath over the past few weeks. He has chronic lower extremity edema which actually has improved over the past week. He does have some abdominal distention. In the emergency room he was found to have complete heart block. He was given atropine. Cardiology was consulted by the ED physician.   VITAL SIGNS:  Blood pressure 108/86, pulse 86, temperature 98.3 F (36.8 C),  temperature source Oral, resp. rate 18, height 6' (1.829 m), weight (!) 139.9 kg (308 lb 8 oz), SpO2 93 %.  I/O:   Intake/Output Summary (Last 24 hours) at 10/08/16 1244 Last data filed at 10/08/16 1003  Gross per 24 hour  Intake              365 ml  Output             2150 ml  Net            -1785 ml    PHYSICAL EXAMINATION:  GENERAL:  61 y.o.-year-old patient lying in the bed with no acute distress.  EYES: Pupils equal, round, reactive to light and accommodation. No scleral icterus. Extraocular muscles intact.  HEENT: Head atraumatic, normocephalic. Oropharynx and nasopharynx clear.  NECK:  Supple, no jugular venous distention. No thyroid enlargement, no tenderness.  LUNGS: Normal breath sounds bilaterally, no wheezing, rales,rhonchi or crepitation. No use of accessory muscles of respiration.  CARDIOVASCULAR: S1, S2 normal. No murmurs, rubs, or gallops. Pace  maker in place, dressing intact. ABDOMEN: Soft, non-tender, non-distended. Bowel sounds present. No organomegaly or mass.  EXTREMITIES: No pedal edema, cyanosis, or clubbing.  NEUROLOGIC: Cranial nerves II through XII are intact. Muscle strength 5/5 in all extremities. Sensation intact. Gait not checked.  PSYCHIATRIC: The patient is alert and oriented x 3.  SKIN: No obvious rash, lesion, or ulcer.   DATA REVIEW:   CBC  Recent Labs Lab 10/06/16 0414  WBC 9.9  HGB 12.3*  HCT 36.8*  PLT 166    Chemistries   Recent Labs Lab 10/05/16 0543  10/08/16 0351 10/08/16 0754  NA 139  < > 141 138  K 3.7  < > 3.3* 3.5  CL 101  < > 103 102  CO2 31  < > 31 29  GLUCOSE 158*  < > 116* 142*  BUN 24*  < > 17 17  CREATININE 1.11  < > 0.76 0.77  CALCIUM 9.0  < > 9.0 9.2  MG 1.8  < > 2.0  --   AST 39  --   --   --   ALT 47  --   --   --   ALKPHOS 89  --   --   --   BILITOT 1.3*  --   --   --   < > = values in this interval not displayed.  Cardiac Enzymes  Recent Labs Lab 10/06/16 0414  TROPONINI 0.04*    Microbiology  Results  Results for orders placed or performed during the hospital encounter of 10/05/16  MRSA PCR Screening     Status: None   Collection Time: 10/05/16  8:59 AM  Result Value Ref Range Status   MRSA by PCR NEGATIVE NEGATIVE Final    Comment:        The GeneXpert MRSA Assay (FDA approved for NASAL specimens only), is one component of a comprehensive MRSA colonization surveillance program. It is not intended to diagnose MRSA infection nor to guide or monitor treatment for MRSA infections.     RADIOLOGY:  Dg Chest 2 View  Result Date: 10/07/2016 CLINICAL DATA:  Hypoxia recent placement of cardiac pacemaker EXAM: CHEST  2 VIEW COMPARISON:  Chest x-ray of 10/06/2016 FINDINGS: Moderate cardiomegaly is stable. However there are more prominent interstitial markings with tiny effusions suggestive of interstitial edema of congestive heart failure. Pacemaker wires remain. No bony abnormality is seen. IMPRESSION: Suspect mild interstitial edema with tiny effusions.  Cardiomegaly Electronically Signed   By: Dwyane Dee M.D.   On: 10/07/2016 13:41   Dg Chest Port 1 View  Result Date: 10/06/2016 CLINICAL DATA:  Status post pacemaker insertion EXAM: PORTABLE CHEST 1 VIEW COMPARISON:  10/06/2016 FINDINGS: Mild bilateral lower lobe scarring/ atelectasis. No focal consolidation. No pleural effusion or pneumothorax. Cardiomegaly.  Left subclavian dual lead pacemaker. IMPRESSION: Left subclavian dual lead pacemaker. No pneumothorax. Electronically Signed   By: Charline Bills M.D.   On: 10/06/2016 15:00   Dg C-arm 1-60 Min-no Report  Result Date: 10/06/2016 Fluoroscopy was utilized by the requesting physician.  No radiographic interpretation.    EKG:   Orders placed or performed during the hospital encounter of 10/05/16  . ED EKG within 10 minutes  . ED EKG within 10 minutes  . EKG 12-Lead  . EKG 12-Lead  . EKG 12-Lead  . EKG 12-Lead  . EKG 12-Lead in am (before 8am)  . EKG 12-Lead in am  (before 8am)      Management plans discussed with the  patient, family and they are in agreement.  CODE STATUS:     Code Status Orders        Start     Ordered   10/05/16 0903  Full code  Continuous     10/05/16 0902    Code Status History    Date Active Date Inactive Code Status Order ID Comments User Context   12/01/2015  7:43 AM 12/03/2015  6:56 PM Full Code [id number]  Arnaldo Natal, MD Inpatient   07/25/2015  8:27 PM 07/31/2015  7:01 PM Full Code [id number]  Houston Siren, MD Inpatient      TOTAL TIME TAKING CARE OF THIS PATIENT: 35 minutes.    Altamese Dilling M.D on 10/08/2016 at 12:44 PM  Between 7am to 6pm - Pager - [phone]  After 6pm go to [email or web address] - password [email or web address]  [facility]  Office  [phone]  CC: Primary care physician; Lynnea Ferrier, MD   Note: This dictation was prepared with Dragon dictation along with smaller phrase technology. Any transcriptional errors that result from this process are unintentional.

## 2016-10-08 NOTE — Progress Notes (Signed)
Pt to be discharged today. Iv and tele removed. Pt ambulated around station. 02 sats 91-92% with activity. Tolerated exercise well. disch instructions and prescrips given to pt and son. To be transported by son.

## 2016-10-08 NOTE — Progress Notes (Signed)
PHARMACY - CRITICAL CARE PROGRESS NOTE  Pharmacy Consult for Electrolyte Monitoring              No Known Allergies  Patient Measurements: Height: 6' (182.9 cm) Weight: (!) 322 lb 12.1 oz (146.4 kg) IBW/kg (Calculated) : 77.6  Vital Signs: Temp: 98.4 F (36.9 C) (03/27 1434) Temp Source: Oral (03/27 1434) BP: 120/69 (03/27 1808) Pulse Rate: 94 (03/27 1808) Intake/Output from previous day: 03/26 0701 - 03/27 0700 In: 690 [P.O.:340; I.V.:300; IV Piggyback:50] Out: 2100 [Urine:2090; Blood:10] Intake/Output from this shift: No intake/output data recorded. Vent settings for last 24 hours: FiO2 (%):  [36 %] 36 %  Labs:  Recent Labs (last 2 labs)    Recent Labs  10/05/16 0543 10/05/16 0902 10/06/16 0414 10/07/16 0442 10/07/16 1748  WBC 11.3* 10.3 9.9  --   --   HGB 13.0 11.8* 12.3*  --   --   HCT 39.3* 35.8* 36.8*  --   --   PLT 164 164 166  --   --   CREATININE 1.11 1.01 0.85 0.70  --   MG 1.8  --  1.7  --  2.2  PHOS 3.6  --   --   --   --   ALBUMIN 4.1  --   --   --   --   PROT 7.3  --   --   --   --   AST 39  --   --   --   --   ALT 47  --   --   --   --   ALKPHOS 89  --   --   --   --   BILITOT 1.3*  --   --   --   --      Estimated Creatinine Clearance: 147.8 mL/min (by C-G formula based on SCr of 0.7 mg/dL).   Recent Labs (last 2 labs)    Recent Labs  10/07/16 0709 10/07/16 1123 10/07/16 1605  GLUCAP 136* 169* 123*      Microbiology:        Recent Results (from the past 720 hour(s))  MRSA PCR Screening     Status: None   Collection Time: 10/05/16  8:59 AM  Result Value Ref Range Status   MRSA by PCR NEGATIVE NEGATIVE Final    Comment:        The GeneXpert MRSA Assay (FDA approved for NASAL specimens only), is one component of a comprehensive MRSA colonization surveillance program. It is not intended to diagnose MRSA infection nor to guide or monitor treatment for MRSA infections.     Assessment: 61 y/o M with a  h/o CHF and OSA admitted with SOB and found to be in heart block. Patient is s/p PPM. Patient is on furosemide 80 mg bid and KCl 20 meq tid.   Plan:  K = 3.8 and Mg = 2.2 this evening after repletion. No additional supplementation necessary at this time - will follow up with AM labs tomorrow.  3/28 @ 0351 K 3.3 -- will give KCI 30 mEq IV x 1 and recheck BMP @ 0800. Mg, Phos WNL.  3/28: K within normal limits. Will recheck electrolytes with am labs.  Thank you for this consult. Demetrius Charityeldrin D. Rasheen Schewe, PharmD  Clinical Pharmacist 10/08/2016

## 2016-10-08 NOTE — Progress Notes (Signed)
Pt c/o  nasal stuffiness/dryness. Orders obtained from standing orders as needed for patient. Pt now transferred to rm [id number]

## 2016-10-08 NOTE — Progress Notes (Deleted)
Pt unable to tolerate CPAP. Pt is coughing and has taken cough medicine but is still uncomfortable wearing the CPAP. Pt returned to 5L [place]

## 2016-10-08 NOTE — Progress Notes (Signed)
PHARMACY - CRITICAL CARE PROGRESS NOTE  Pharmacy Consult for Electrolyte Monitoring              No Known Allergies  Patient Measurements: Height: 6' (182.9 cm) Weight: (!) 322 lb 12.1 oz (146.4 kg) IBW/kg (Calculated) : 77.6  Vital Signs: Temp: 98.4 F (36.9 C) (03/27 1434) Temp Source: Oral (03/27 1434) BP: 120/69 (03/27 1808) Pulse Rate: 94 (03/27 1808) Intake/Output from previous day: 03/26 0701 - 03/27 0700 In: 690 [P.O.:340; I.V.:300; IV Piggyback:50] Out: 2100 [Urine:2090; Blood:10] Intake/Output from this shift: No intake/output data recorded. Vent settings for last 24 hours: FiO2 (%):  [36 %] 36 %  Labs:  Recent Labs (last 2 labs)    Recent Labs  10/05/16 0543 10/05/16 0902 10/06/16 0414 10/07/16 0442 10/07/16 1748  WBC 11.3* 10.3 9.9  --   --   HGB 13.0 11.8* 12.3*  --   --   HCT 39.3* 35.8* 36.8*  --   --   PLT 164 164 166  --   --   CREATININE 1.11 1.01 0.85 0.70  --   MG 1.8  --  1.7  --  2.2  PHOS 3.6  --   --   --   --   ALBUMIN 4.1  --   --   --   --   PROT 7.3  --   --   --   --   AST 39  --   --   --   --   ALT 47  --   --   --   --   ALKPHOS 89  --   --   --   --   BILITOT 1.3*  --   --   --   --      Estimated Creatinine Clearance: 147.8 mL/min (by C-G formula based on SCr of 0.7 mg/dL).   Recent Labs (last 2 labs)    Recent Labs  10/07/16 0709 10/07/16 1123 10/07/16 1605  GLUCAP 136* 169* 123*      Microbiology:        Recent Results (from the past 720 hour(s))  MRSA PCR Screening     Status: None   Collection Time: 10/05/16  8:59 AM  Result Value Ref Range Status   MRSA by PCR NEGATIVE NEGATIVE Final    Comment:        The GeneXpert MRSA Assay (FDA approved for NASAL specimens only), is one component of a comprehensive MRSA colonization surveillance program. It is not intended to diagnose MRSA infection nor to guide or monitor treatment for MRSA infections.     Assessment: 61 y/o M with a  h/o CHF and OSA admitted with SOB and found to be in heart block. Patient is s/p PPM. Patient is on furosemide 80 mg bid and KCl 20 meq tid.   Plan:  K = 3.8 and Mg = 2.2 this evening after repletion. No additional supplementation necessary at this time - will follow up with AM labs tomorrow.  3/28 @ 0351 K 3.3 -- will give KCI 30 mEq IV x 1 and recheck BMP @ 0800. Mg, Phos WNL.  Thank you for this consult.  Thomasene Rippleavid Makya Phillis, PharmD, BCPS Clinical Pharmacist 10/08/2016

## 2016-10-08 NOTE — Progress Notes (Signed)
Pt is unable to tolerate CPAP for sleep. Pt returned to [place] with an increase of liter flow to 5L

## 2016-10-28 LAB — HIV ANTIBODY (ROUTINE TESTING W REFLEX)

## 2017-03-25 IMAGING — CR DG ANKLE COMPLETE 3+V*L*
1 series · 3 of 3 positions shown · non-contrast
Comparison: None.

CLINICAL DATA: Left ankle pain for 1 week.  No known injury.

EXAM:
LEFT ANKLE COMPLETE - 3+ VIEW

[Series 1: x ankle ap left · 0.14mm/px · 3 of 3 slices shown]
[im 1/3]
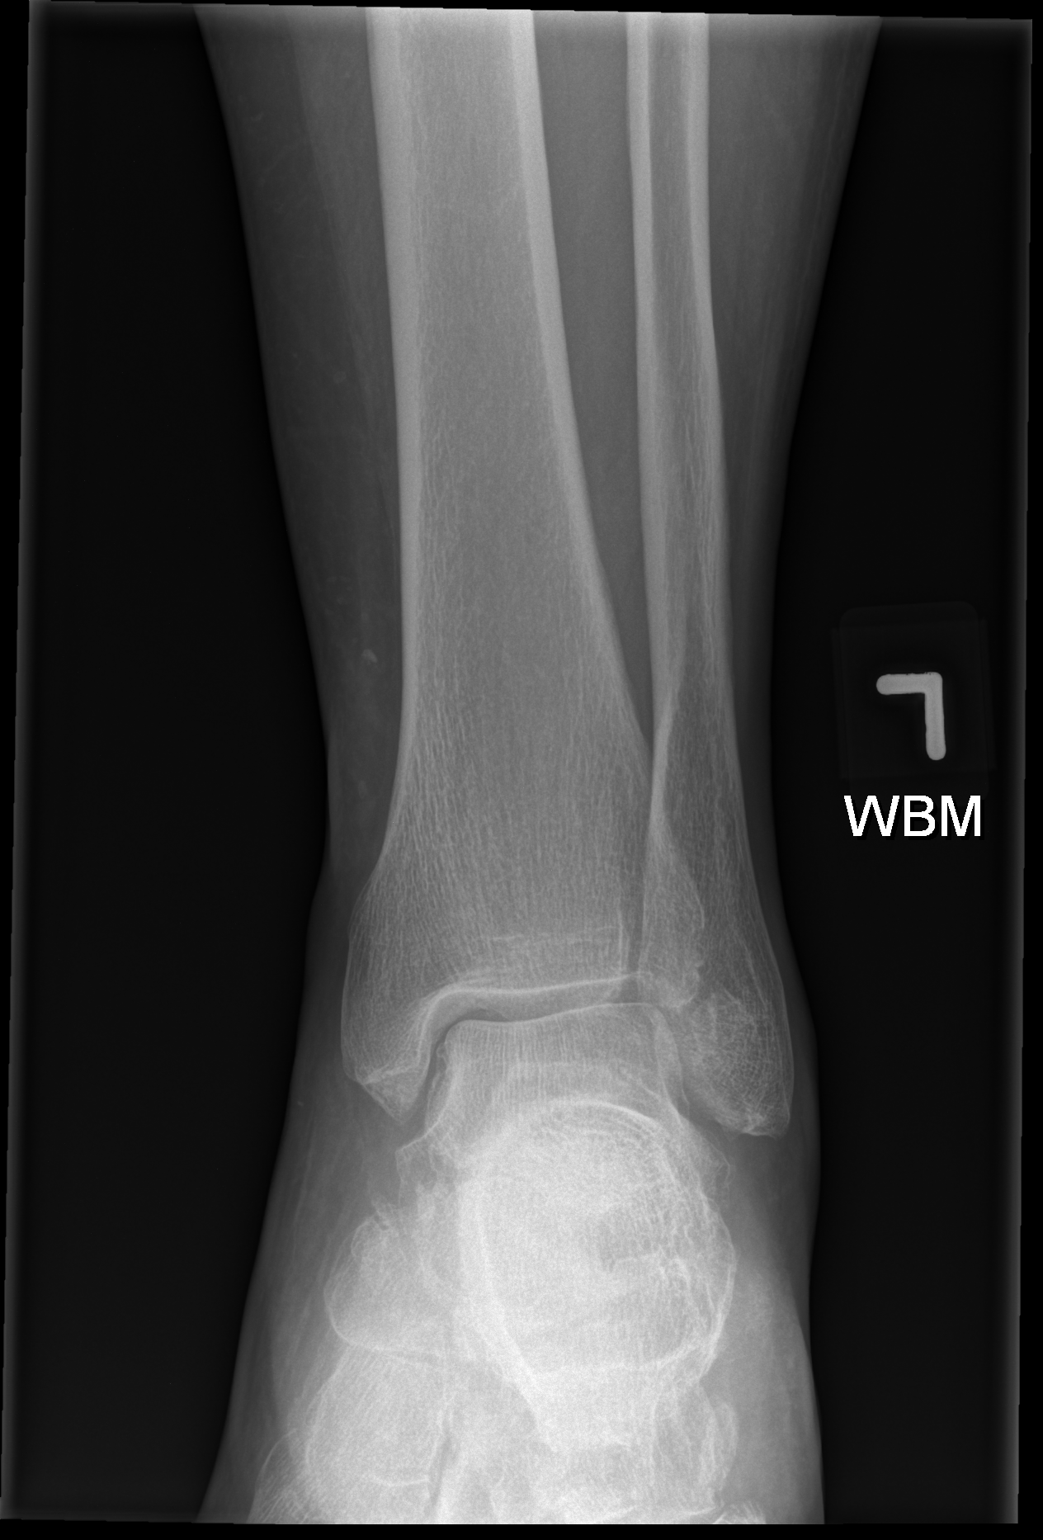
[im 2/3]
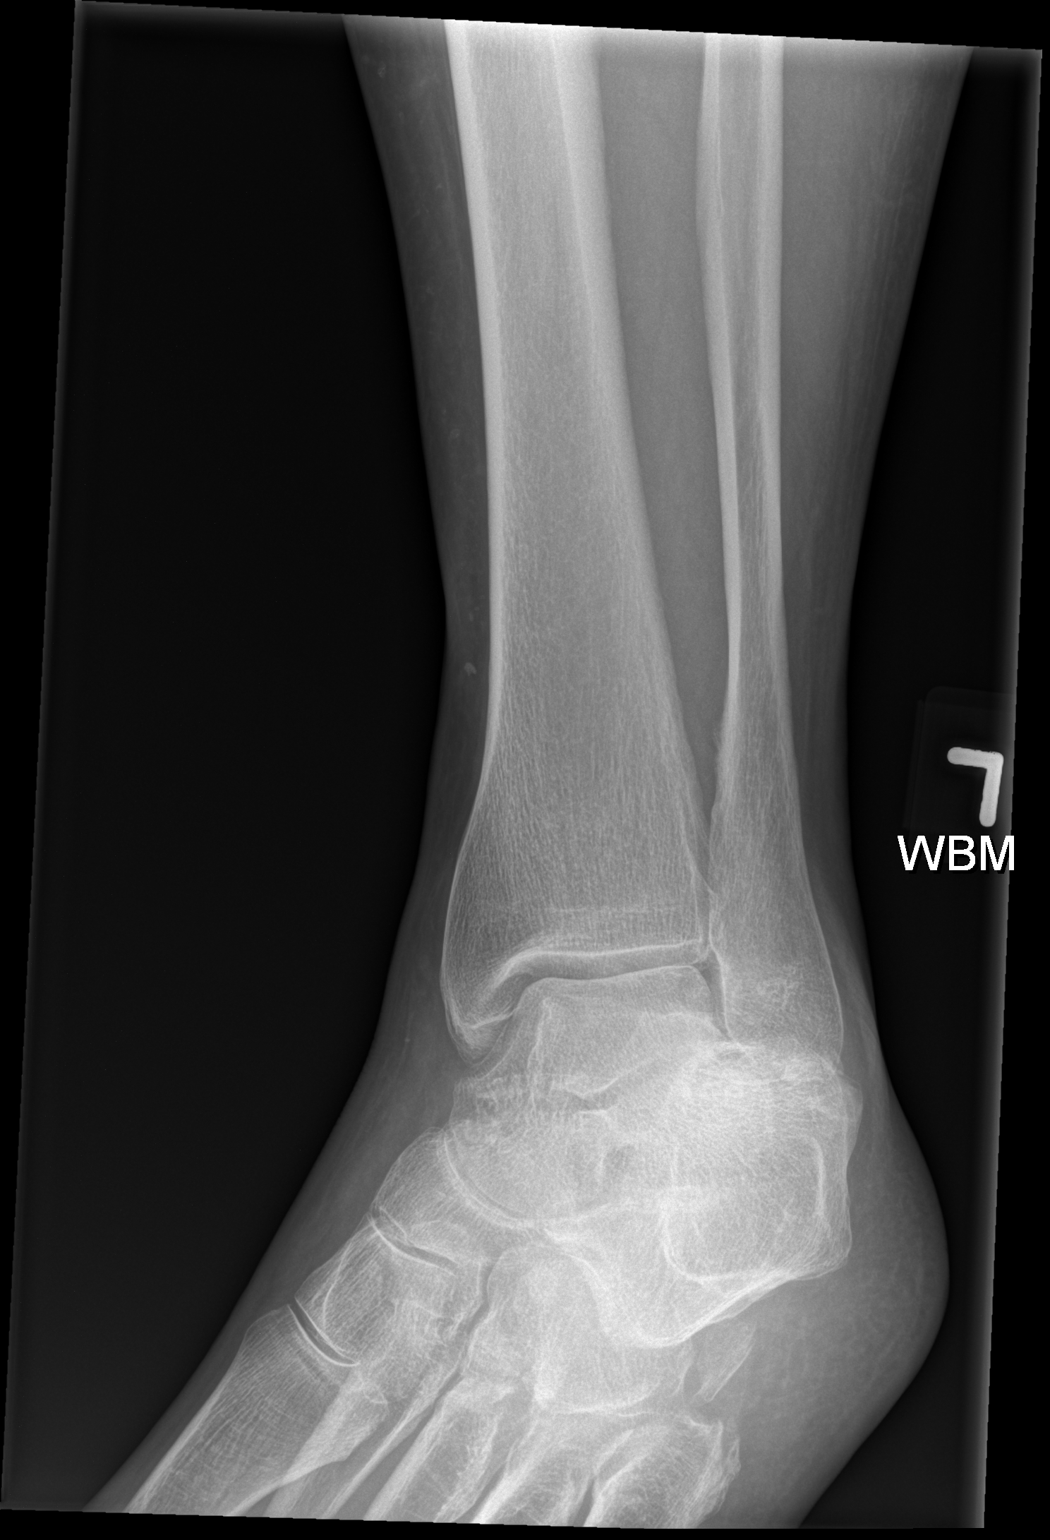
[im 3/3]
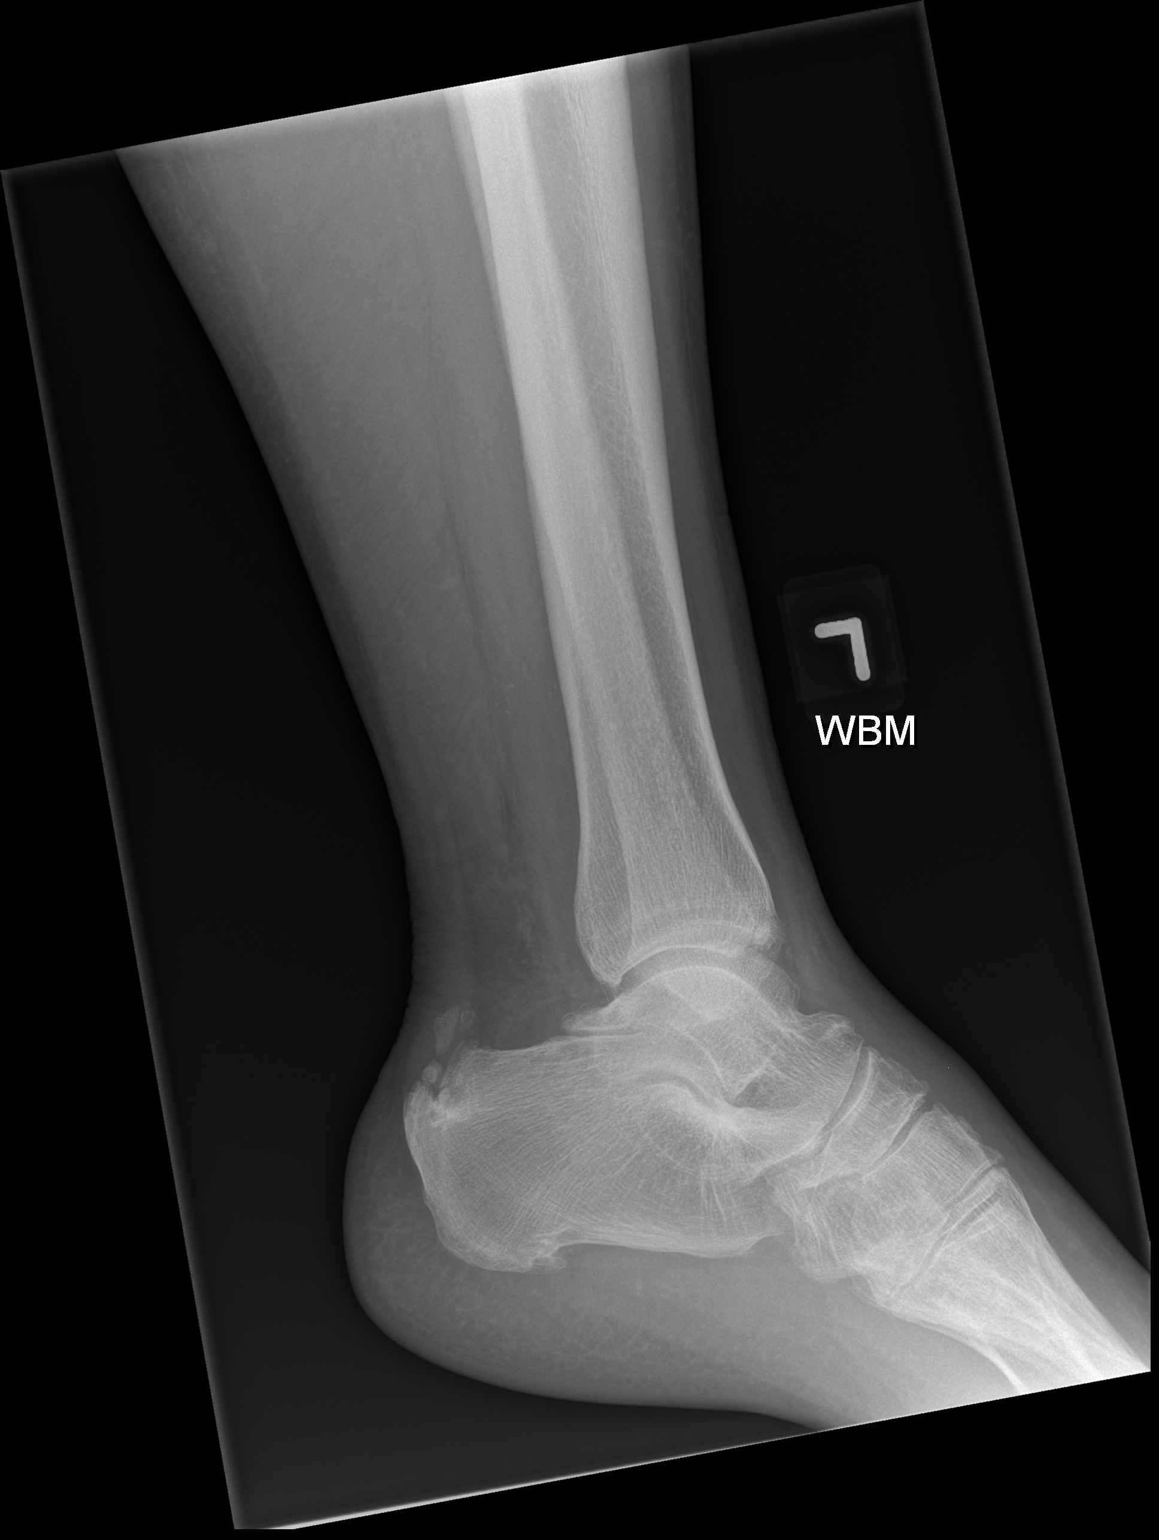

[3 of 3 positions shown; findings below may reference images not displayed]

FINDINGS: The mineralization and alignment are normal. There is no evidence of
acute fracture or dislocation. There are mild degenerative changes
at the ankle and in the midfoot. There is fragmented spurring along
the posterior aspect of the calcaneal tuberosity. No focal soft
tissue swelling identified.
IMPRESSION: No acute osseous findings. Mild degenerative changes and calcaneal
spurring.

## 2017-03-27 IMAGING — CR DG CHEST 1V PORT
1 series · 1 of 1 positions shown · non-contrast
Comparison: None.

CLINICAL DATA: Worsening shortness of breath and right shoulder
pain since a fall 07/21/2015. Initial encounter.

EXAM:
PORTABLE CHEST 1 VIEW

[ap]
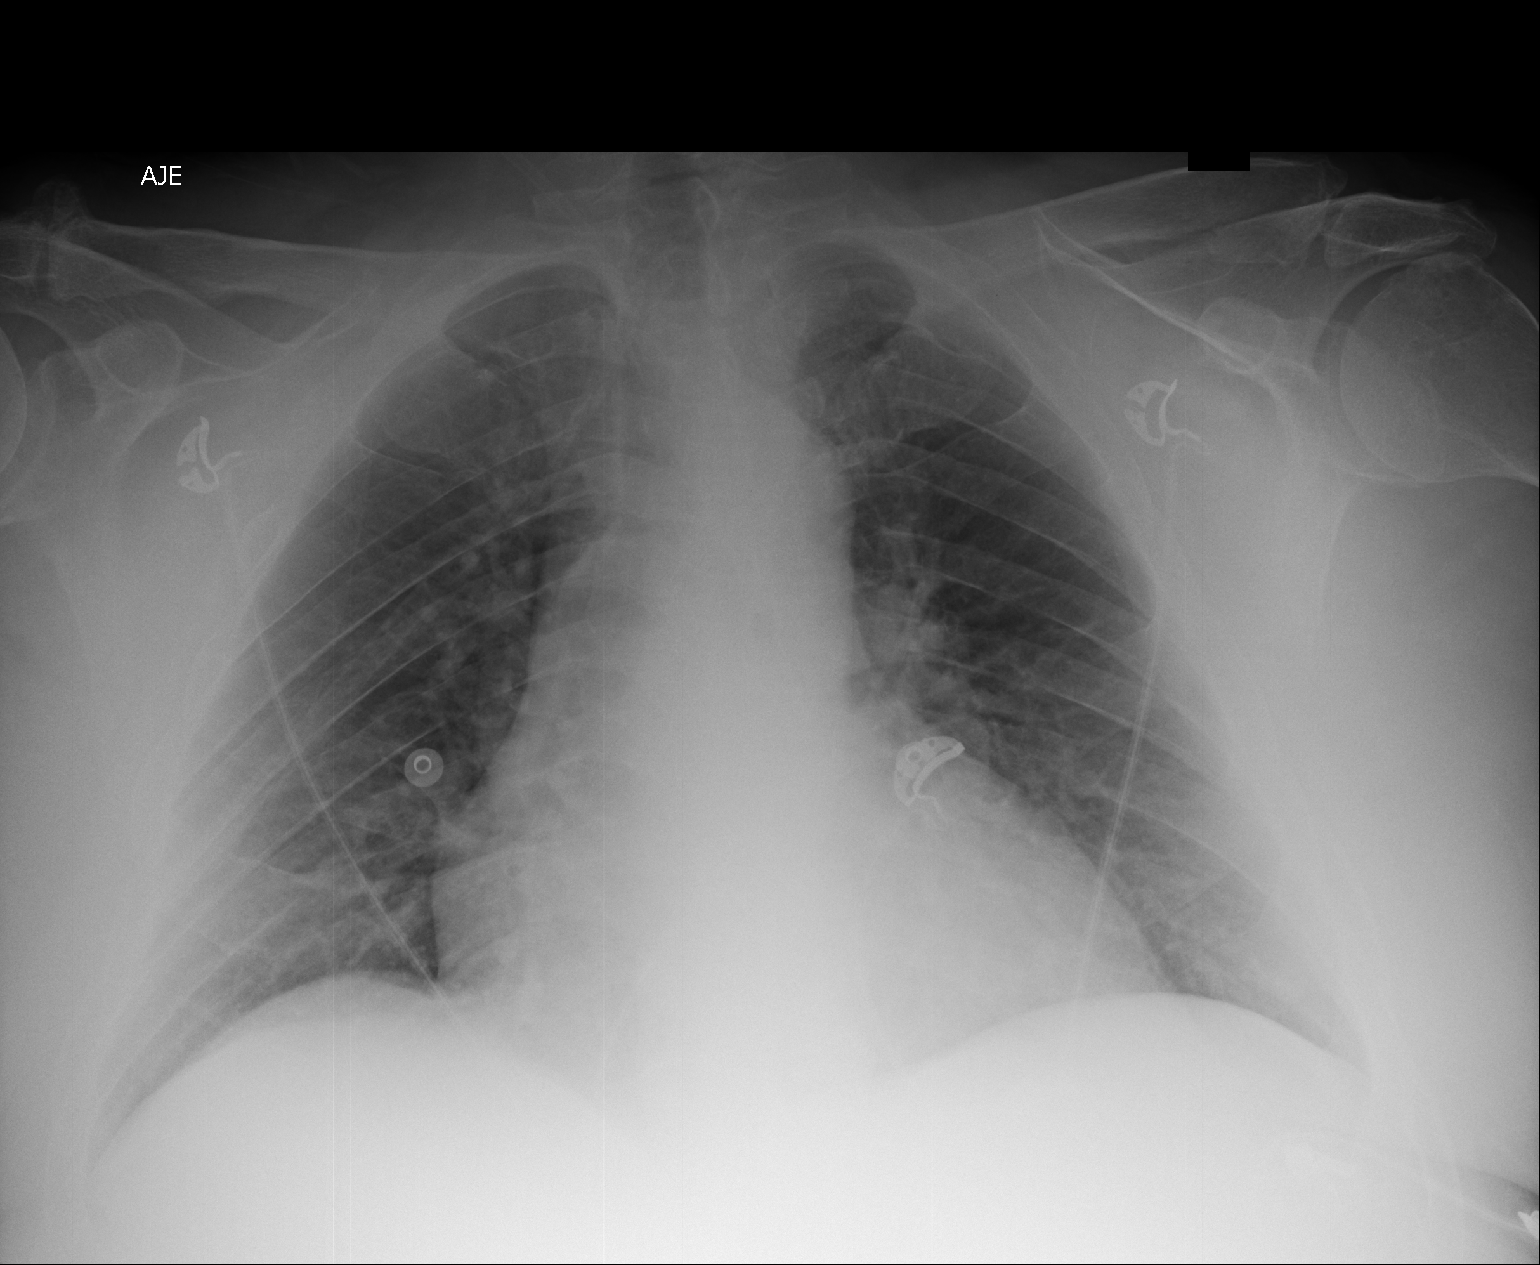

[1 of 1 positions shown; findings below may reference images not displayed]

FINDINGS: The lungs are clear. Heart size is normal. No pneumothorax or
pleural effusion. No focal bony abnormality. Acromioclavicular
degenerative change is noted.
IMPRESSION: No acute disease.

## 2017-03-28 IMAGING — CT CT SHOULDER*R* W/O CM
3 series · 13 of 20 positions shown, 15 images · non-contrast
Comparison: 07/23/2015 radiographs

CLINICAL DATA: Right shoulder pain.  Fall this past weekend.

EXAM:
CT OF THE RIGHT SHOULDER WITHOUT CONTRAST
TECHNIQUE: Multidetector CT imaging was performed according to the standard
protocol. Multiplanar CT image reconstructions were also generated.

[Series 4: bone 1.5 · axial · 0.58mm/px · z∈[-74,+52]mm · 4 of 141 slices shown]
[im 29/141  bone]
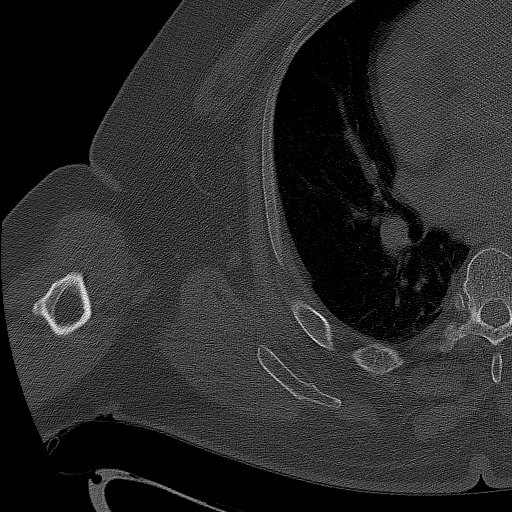
[im 57/141  bone]
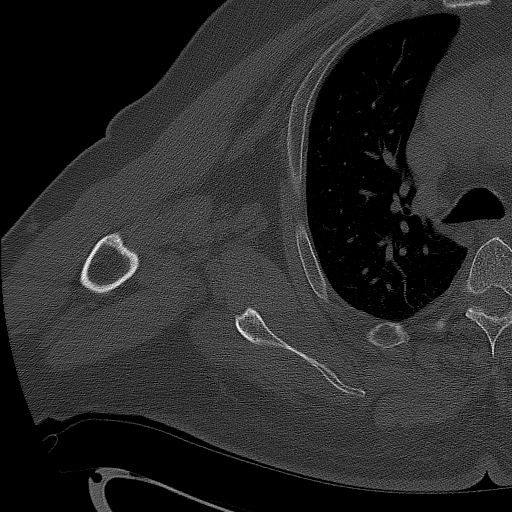
[im 85/141  bone]
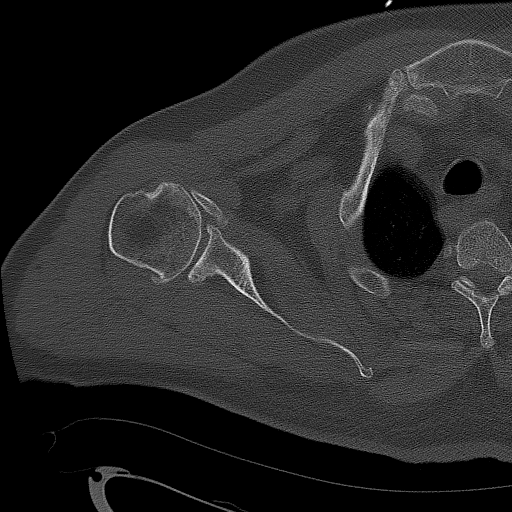
[im 113/141  bone]
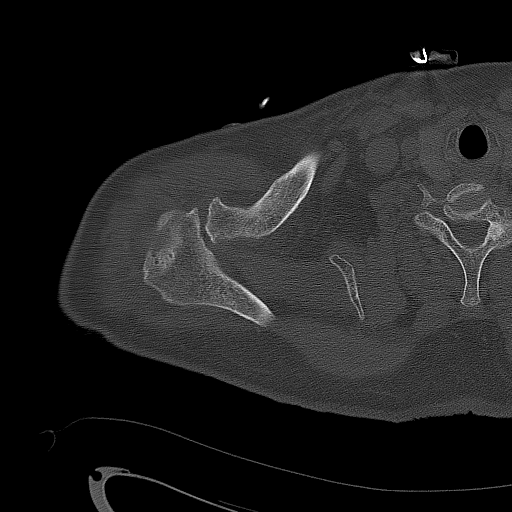

[Series 8: st axials · axial · 0.58mm/px · z∈[-71,+61]mm · 6 of 205 slices shown, 8 images]
[im 30/205  soft-tissue]
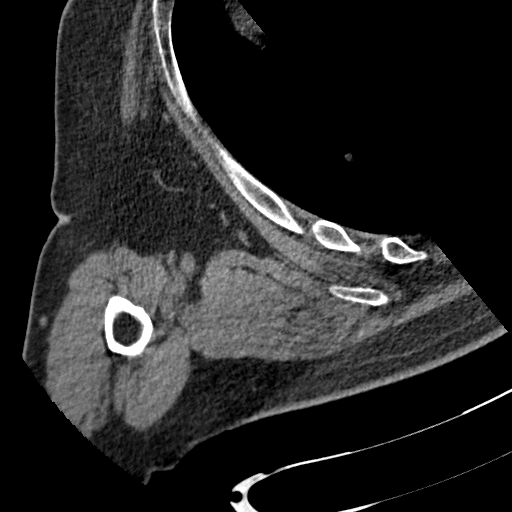
[im 30/205  bone]
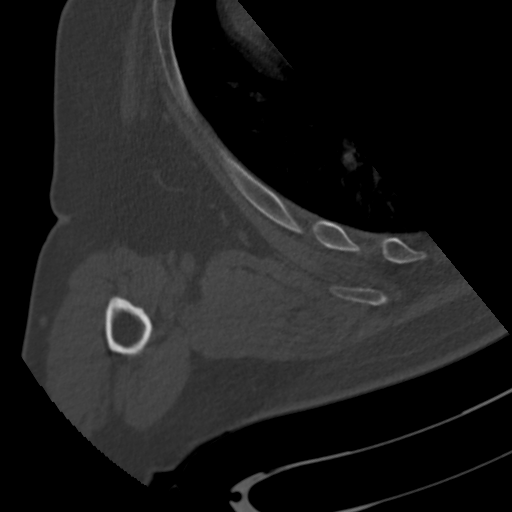
[im 59/205  bone]
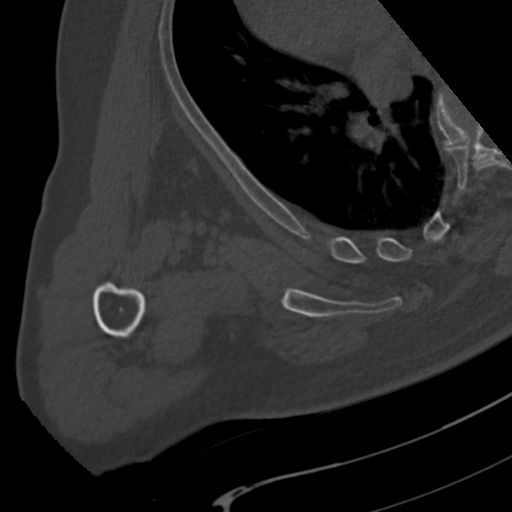
[im 88/205  bone]
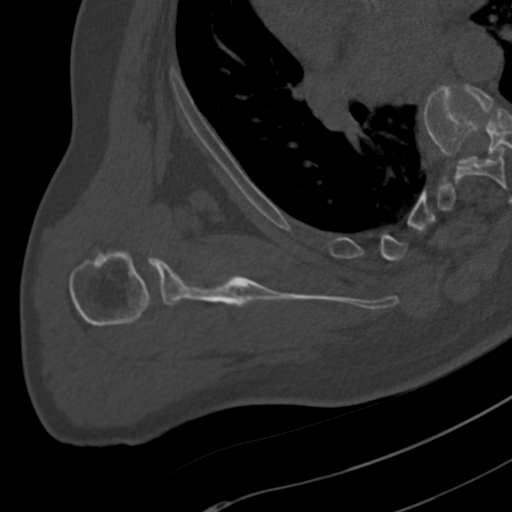
[im 117/205  bone]
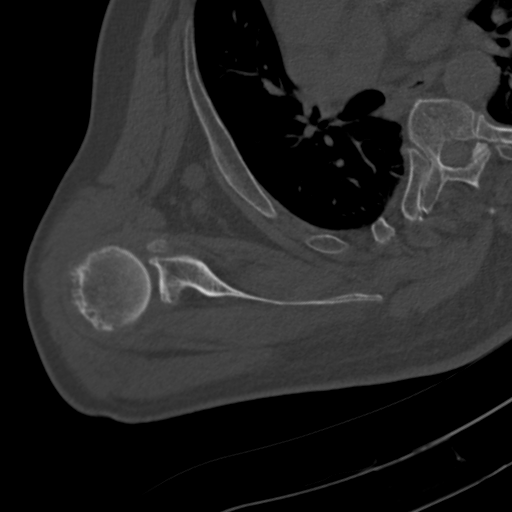
[im 146/205  soft-tissue]
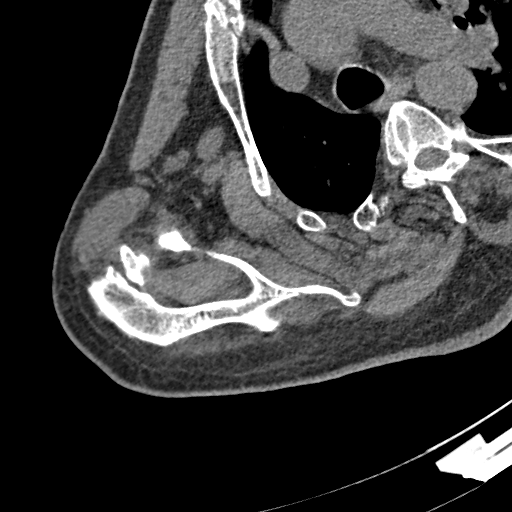
[im 146/205  bone]
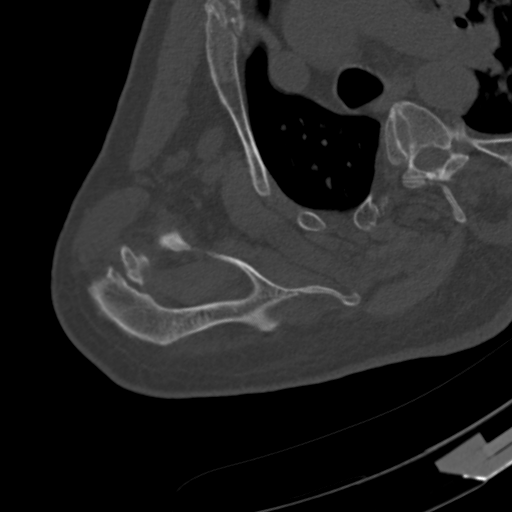
[im 175/205  bone]
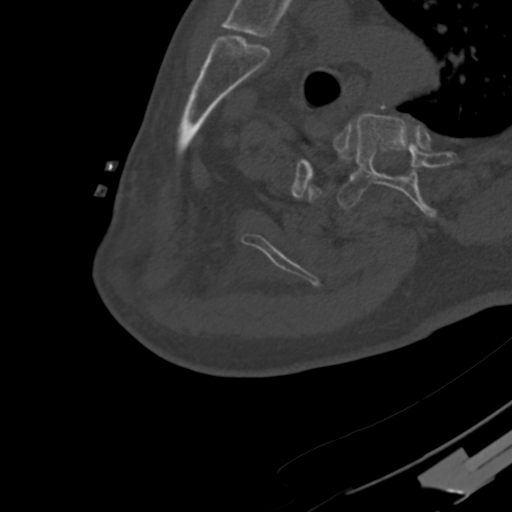

[Series 9: st cor · coronal · 0.58mm/px · 3 of 136 slices shown]
[im 28/136  bone]
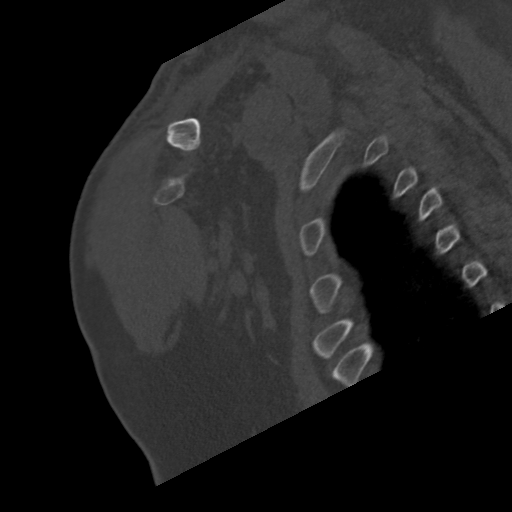
[im 55/136  bone]
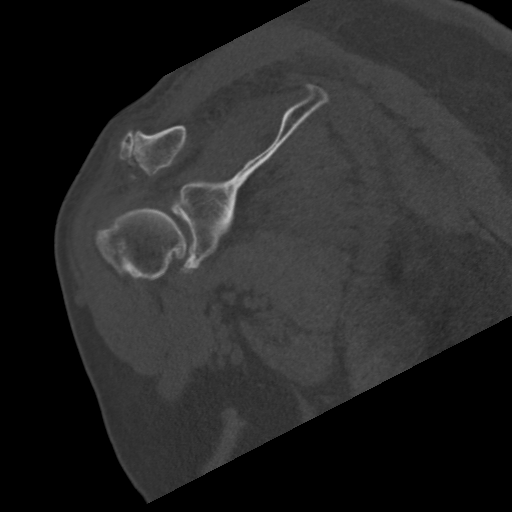
[im 82/136  bone]
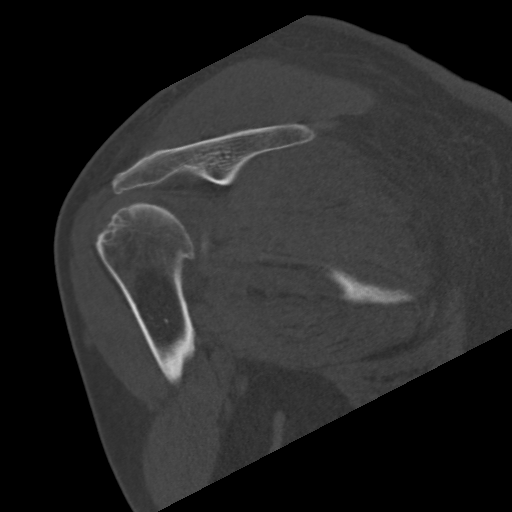

[13 of 20 positions shown; findings below may reference images not displayed]

FINDINGS: No regional fracture or dislocation.

Moderate degenerative AC joint arthropathy with a small well
corticated ossicle along the anterior margin of the AC joint, likely
from a chronically fragmented spur from the distal anterior
clavicle. Moderate distal clavicular spurring.

Ossific structures within or along the distal subscapularis tendon
observed. Extensive glenohumeral spurring. Chronically fragmented
inferior spur from the anterior inferior glenoid.

Subacromial morphology is type 2 (curved). No overt atrophy of the
rotator cuff musculature. Today's CT exam is not accurate in
assessing for rotator cuff tear or in assessing the labrum or
biceps. I am suspicious that there is likely a small amount of fluid
in the subacromial subdeltoid bursa. There is also mid some edema
tracking around the AC joint. Also there is edema tracking along the
distal trapezius muscle and adjacent subcutaneous tissues.
IMPRESSION: 1. No fracture or dislocation identified.
2. Prominent degenerative glenohumeral arthropathy and moderate
degenerative AC joint arthropathy.
3. Suspected heterotopic calcification along the distal
subscapularis tendon with multiple well corticated ossific
structures in this vicinity. Chronically fragmented inferior
spurring from the anterior inferior glenoid.
4. I suspect subacromial subdeltoid bursitis.
5. Edema along the superficial margin of the trapezius muscle
-trapezius strain not excluded.

## 2017-11-11 DEATH — deceased
